# Patient Record
Sex: Male | Born: 1985 | Race: White | Hispanic: No | Marital: Single | Smoking: Current every day smoker
Health system: Southern US, Community
[De-identification: ages and names within clinical notes are randomized; demographics above are authoritative.]

## PROBLEM LIST (undated history)

## (undated) DIAGNOSIS — E079 Disorder of thyroid, unspecified: Secondary | ICD-10-CM

## (undated) HISTORY — PX: TONSILLECTOMY: SUR1361

---
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
## 2008-02-26 ENCOUNTER — Emergency Department: Payer: Self-pay | Admitting: Emergency Medicine

## 2014-01-17 ENCOUNTER — Emergency Department: Payer: Self-pay | Admitting: Emergency Medicine

## 2017-12-16 ENCOUNTER — Other Ambulatory Visit: Payer: Self-pay

## 2017-12-16 ENCOUNTER — Emergency Department
Admission: EM | Admit: 2017-12-16 | Discharge: 2017-12-16 | Disposition: A | Discharge status: Home / Self Care | Payer: Self-pay | Attending: Emergency Medicine | Admitting: Emergency Medicine

## 2017-12-16 ENCOUNTER — Encounter: Payer: Self-pay | Admitting: *Deleted

## 2017-12-16 DIAGNOSIS — F17200 Nicotine dependence, unspecified, uncomplicated: Secondary | ICD-10-CM | POA: Insufficient documentation

## 2017-12-16 DIAGNOSIS — K120 Recurrent oral aphthae: Secondary | ICD-10-CM | POA: Insufficient documentation

## 2017-12-16 MED ORDER — DEXAMETHASONE SODIUM PHOSPHATE 10 MG/ML IJ SOLN
10.0000 mg | Freq: Once | INTRAMUSCULAR | Status: AC
  Administered 2017-12-16: 10 mg via INTRAMUSCULAR
  Filled 2017-12-16: qty 1

## 2017-12-16 MED ORDER — PREDNISONE 10 MG PO TABS: 10.0000 mg | ORAL_TABLET | Freq: Two times a day (BID) | ORAL | 0 refills | Status: DC

## 2017-12-16 MED ORDER — MAGIC MOUTHWASH W/LIDOCAINE: 5.0000 mL | Freq: Four times a day (QID) | ORAL | 0 refills | Status: DC | PRN

## 2017-12-16 MED ORDER — LIDOCAINE VISCOUS HCL 2 % MT SOLN
15.0000 mL | Freq: Once | OROMUCOSAL | Status: AC
  Administered 2017-12-16: 15 mL via OROMUCOSAL
  Filled 2017-12-16: qty 15

## 2017-12-16 NOTE — Discharge Instructions (Addendum)
You are being treated for stomatitis. Take the steroid as directed. Use the prescription mouthwash solution as needed. Follow-up with your provider as needed.

## 2017-12-16 NOTE — ED Triage Notes (Signed)
Pt says that tonight while at work he started having itching and dryness in his mouth. He says he has painful areas that feel like sores on the sides of his tongue and lower lips.

## 2017-12-17 NOTE — ED Provider Notes (Signed)
[facility] Emergency Department Provider Note ____________________________________________  Time seen: 2146  I have reviewed the triage vital signs and the nursing notes.  HISTORY  Chief Complaint  Mouth Pain  History as told to Loney Loh, PA-S Sherrie Sport)  HPI Louis Wade is a 32 y.o. male presents to the ED, from work, and accompanied by his family, for evaluation of sudden onset of mild sores.  Patient describes while at work tonight during his third shift, and to experience itching and dryness to his mouth.  He also describes painful areas that feel like sores, to the size of his tongue and inside his lower lip.  He denies any difficulty breathing, swallowing, or controlling secretions.  Also denies any fevers, chills, sweats.  He has been able to eat and drink without significant difficulty.  He denies any other known exposures or any recent illness.  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  He denies any requirement, however, for PPE doing his typical job activities. Of note is that the patient reports that he and his family were exposed to meningitis, and were apparently treated prophylactically last week.  He, however, admits that he did not take his prescribed prophylaxis, as directed. He is currently about 3 days into a 10-day course of Augmentin, and a topical eardrop, for a presumed right otitis medias/otitis externa.  History reviewed. No pertinent past medical history.  There are no active problems to display for this patient.  Past Surgical History:  Procedure Laterality Date  . TONSILLECTOMY      Prior to Admission medications   Medication Sig Start Date End Date Taking? Authorizing Provider  magic mouthwash w/lidocaine SOLN Take 5 mLs  by mouth 4 (four) times daily as needed for mouth pain. 12/16/17   Bitha Fauteux, Charlesetta Ivory, PA-C  predniSONE (DELTASONE) 10 MG tablet Take 1 tablet (10 mg total) by mouth 2 (two) times daily with a meal. 12/16/17   Yehudit Fulginiti, Charlesetta Ivory, PA-C   Allergies Patient has no known allergies.  No family history on file.  Social History Social History   Tobacco Use  . Smoking status: Current Every Day Smoker  Substance Use Topics  . Alcohol use: Yes  . Drug use: Never   Review of Systems  Constitutional: Negative for fever. Eyes: Negative for visual changes. ENT: Negative for sore throat. Mouth sores as above. Cardiovascular: Negative for chest pain. Respiratory: Negative for shortness of breath. Gastrointestinal: Negative for abdominal pain, vomiting and diarrhea. Genitourinary: Negative for dysuria. Musculoskeletal: Negative for back pain. Skin: Negative for rash. Neurological: Negative for headaches, focal weakness or numbness. ____________________________________________  PHYSICAL EXAM:  VITAL SIGNS: ED Triage Vitals  Enc Vitals Group     BP 12/16/17 2110 (!) 144/89     Pulse Rate 12/16/17 2110 (!) 113     Resp 12/16/17 2110 16     Temp 12/16/17 2110 98.4 F (36.9 C)     Temp src --      SpO2 12/16/17 2110 98 %     Weight 12/16/17 2111 185 lb (83.9 kg)     Height 12/16/17 2111 5\' 9"  (1.753 m)     Head Circumference --      Peak Flow --      Pain Score 12/16/17  2111 4     Pain Loc --      Pain Edu? --      Excl. in GC? --     Constitutional: Alert and oriented. Well appearing and in no distress. Head: Normocephalic and atraumatic. Eyes: Conjunctivae are normal. Normal extraocular movements Ears: Canals clear and without edema. TMs intact bilaterally. Right TM with scant yellow exudate noted.  Nose: No congestion/rhinorrhea/epistaxis. Mouth/Throat: Mucous membranes are moist.  Uvula is midline and tonsils are flat.  No oropharyngeal lesions are appreciated.   Patient with superficial ulceration noted to the lateral aspects of the tongue bilaterally.  He also appears to have a shallow white ulceration to the buccal mucosa of the lower lip.  No brawny sublingual erythema is noted.  No submandibular edema is palpated. Neck: Supple. No thyromegaly. Normal ROM. Hematological/Lymphatic/Immunological: No cervical lymphadenopathy. Cardiovascular: Normal rate, regular rhythm. Normal distal pulses. Respiratory: Normal respiratory effort. No wheezes/rales/rhonchi. Psychiatric: Mood and affect are normal. Patient exhibits appropriate insight and judgment. ____________________________________________  PROCEDURES  Procedures Dexamethasone 10 mg IM 2% viscous lido gargle ____________________________________________  INITIAL IMPRESSION / ASSESSMENT AND PLAN / ED COURSE  Patient with ED evaluation of sudden mouth pain and stomatitis likely due to aphthous ulcers.  He has several distinct ulceration noted to the lateral aspect of his tongue and buccal mucosa.  Patient was treated in the ED with a topical dose of viscous lidocaine.  He is also given an IM injection of Decadron to help with some subjective edema to the tongue.  Patient has control of his oral secretions and no signs of airway compromise.  His symptoms do not appear to be related to a chemical exposure at this time.  Patient will be discharged with a prescription for prednisone to dose as directed.  He is also given a prescription for a mouthwash solution to gargle as needed.  He is encouraged to follow with the local community clinic or return to the ED as needed.  Work is provided for [employer] as requested. ____________________________________________  FINAL CLINICAL IMPRESSION(S) / ED DIAGNOSES  Final diagnoses:  Aphthous ulcer of mouth      Rhianna Raulerson, Charlesetta Ivory, PA-C 12/17/17 1633    Dionne Bucy, MD 12/18/17 1112

## 2018-08-05 ENCOUNTER — Encounter: Payer: Self-pay | Admitting: Psychiatry

## 2018-08-05 ENCOUNTER — Emergency Department (HOSPITAL_COMMUNITY)
Admission: EM | Admit: 2018-08-05 | Discharge: 2018-08-05 | Disposition: A | Discharge status: Other Acute Inpatient Hospital | Payer: 59 | Source: Home / Self Care | Attending: Emergency Medicine | Admitting: Emergency Medicine

## 2018-08-05 ENCOUNTER — Other Ambulatory Visit: Payer: Self-pay

## 2018-08-05 ENCOUNTER — Inpatient Hospital Stay
Admission: AD | Admit: 2018-08-05 | Discharge: 2018-08-09 | DRG: 885 | Disposition: A | Discharge status: Home / Self Care | Payer: 59 | Attending: Psychiatry | Admitting: Psychiatry

## 2018-08-05 DIAGNOSIS — R41843 Psychomotor deficit: Secondary | ICD-10-CM | POA: Diagnosis present

## 2018-08-05 DIAGNOSIS — F1010 Alcohol abuse, uncomplicated: Secondary | ICD-10-CM | POA: Diagnosis present

## 2018-08-05 DIAGNOSIS — Z79890 Hormone replacement therapy (postmenopausal): Secondary | ICD-10-CM | POA: Diagnosis not present

## 2018-08-05 DIAGNOSIS — Z1159 Encounter for screening for other viral diseases: Secondary | ICD-10-CM

## 2018-08-05 DIAGNOSIS — E079 Disorder of thyroid, unspecified: Secondary | ICD-10-CM

## 2018-08-05 DIAGNOSIS — X838XXA Intentional self-harm by other specified means, initial encounter: Secondary | ICD-10-CM | POA: Insufficient documentation

## 2018-08-05 DIAGNOSIS — Y92009 Unspecified place in unspecified non-institutional (private) residence as the place of occurrence of the external cause: Secondary | ICD-10-CM | POA: Insufficient documentation

## 2018-08-05 DIAGNOSIS — F322 Major depressive disorder, single episode, severe without psychotic features: Secondary | ICD-10-CM

## 2018-08-05 DIAGNOSIS — Y903 Blood alcohol level of 60-79 mg/100 ml: Secondary | ICD-10-CM | POA: Insufficient documentation

## 2018-08-05 DIAGNOSIS — T1491XA Suicide attempt, initial encounter: Secondary | ICD-10-CM

## 2018-08-05 DIAGNOSIS — Y939 Activity, unspecified: Secondary | ICD-10-CM | POA: Insufficient documentation

## 2018-08-05 DIAGNOSIS — T50902A Poisoning by unspecified drugs, medicaments and biological substances, intentional self-harm, initial encounter: Secondary | ICD-10-CM

## 2018-08-05 DIAGNOSIS — Y999 Unspecified external cause status: Secondary | ICD-10-CM | POA: Insufficient documentation

## 2018-08-05 DIAGNOSIS — F17200 Nicotine dependence, unspecified, uncomplicated: Secondary | ICD-10-CM | POA: Insufficient documentation

## 2018-08-05 DIAGNOSIS — R51 Headache: Secondary | ICD-10-CM | POA: Diagnosis present

## 2018-08-05 DIAGNOSIS — R519 Headache, unspecified: Secondary | ICD-10-CM

## 2018-08-05 DIAGNOSIS — F323 Major depressive disorder, single episode, severe with psychotic features: Secondary | ICD-10-CM | POA: Diagnosis not present

## 2018-08-05 DIAGNOSIS — R4589 Other symptoms and signs involving emotional state: Secondary | ICD-10-CM | POA: Diagnosis present

## 2018-08-05 DIAGNOSIS — T423X2A Poisoning by barbiturates, intentional self-harm, initial encounter: Secondary | ICD-10-CM | POA: Insufficient documentation

## 2018-08-05 DIAGNOSIS — R45851 Suicidal ideations: Secondary | ICD-10-CM | POA: Insufficient documentation

## 2018-08-05 DIAGNOSIS — Z20828 Contact with and (suspected) exposure to other viral communicable diseases: Secondary | ICD-10-CM | POA: Insufficient documentation

## 2018-08-05 DIAGNOSIS — T50901A Poisoning by unspecified drugs, medicaments and biological substances, accidental (unintentional), initial encounter: Secondary | ICD-10-CM

## 2018-08-05 DIAGNOSIS — F329 Major depressive disorder, single episode, unspecified: Secondary | ICD-10-CM | POA: Diagnosis not present

## 2018-08-05 HISTORY — DX: Disorder of thyroid, unspecified: E07.9

## 2018-08-05 LAB — CBC WITH DIFFERENTIAL/PLATELET
Abs Immature Granulocytes: 0.04 10*3/uL (ref 0.00–0.07)
Basophils Absolute: 0 10*3/uL (ref 0.0–0.1)
Basophils Relative: 1 %
Eosinophils Absolute: 0.1 10*3/uL (ref 0.0–0.5)
Eosinophils Relative: 2 %
HCT: 43.3 % (ref 39.0–52.0)
Hemoglobin: 15.3 g/dL (ref 13.0–17.0)
Immature Granulocytes: 1 %
Lymphocytes Relative: 34 %
Lymphs Abs: 2 10*3/uL (ref 0.7–4.0)
MCH: 33.8 pg (ref 26.0–34.0)
MCHC: 35.3 g/dL (ref 30.0–36.0)
MCV: 95.8 fL (ref 80.0–100.0)
Monocytes Absolute: 0.5 10*3/uL (ref 0.1–1.0)
Monocytes Relative: 9 %
Neutro Abs: 3.1 10*3/uL (ref 1.7–7.7)
Neutrophils Relative %: 53 %
Platelets: 317 10*3/uL (ref 150–400)
RBC: 4.52 MIL/uL (ref 4.22–5.81)
RDW: 12.4 % (ref 11.5–15.5)
WBC: 5.8 10*3/uL (ref 4.0–10.5)
nRBC: 0 % (ref 0.0–0.2)

## 2018-08-05 LAB — COMPREHENSIVE METABOLIC PANEL
ALT: 96 U/L — ABNORMAL HIGH (ref 0–44)
AST: 65 U/L — ABNORMAL HIGH (ref 15–41)
Albumin: 5 g/dL (ref 3.5–5.0)
Alkaline Phosphatase: 76 U/L (ref 38–126)
Anion gap: 13 (ref 5–15)
BUN: 10 mg/dL (ref 6–20)
CO2: 22 mmol/L (ref 22–32)
Calcium: 9.2 mg/dL (ref 8.9–10.3)
Chloride: 103 mmol/L (ref 98–111)
Creatinine, Ser: 0.86 mg/dL (ref 0.61–1.24)
GFR calc Af Amer: >60 mL/min (ref >60)
GFR calc non Af Amer: >60 mL/min (ref >60)
Glucose, Bld: 111 mg/dL — ABNORMAL HIGH (ref 70–99)
Potassium: 3.4 mmol/L — ABNORMAL LOW (ref 3.5–5.1)
Sodium: 138 mmol/L (ref 135–145)
Total Bilirubin: 0.3 mg/dL (ref 0.3–1.2)
Total Protein: 8.6 g/dL — ABNORMAL HIGH (ref 6.5–8.1)

## 2018-08-05 LAB — URINALYSIS, COMPLETE (UACMP) WITH MICROSCOPIC
Bacteria, UA: NONE SEEN
Bilirubin Urine: NEGATIVE
Glucose, UA: NEGATIVE mg/dL
Hgb urine dipstick: NEGATIVE
Ketones, ur: NEGATIVE mg/dL
Leukocytes,Ua: NEGATIVE
Nitrite: NEGATIVE
Protein, ur: NEGATIVE mg/dL
Specific Gravity, Urine: 1.02 (ref 1.005–1.030)
Squamous Epithelial / HPF: NONE SEEN (ref 0–5)
pH: 6 (ref 5.0–8.0)

## 2018-08-05 LAB — URINE DRUG SCREEN, QUALITATIVE (ARMC ONLY)
Amphetamines, Ur Screen: NOT DETECTED
Barbiturates, Ur Screen: POSITIVE — AB
Benzodiazepine, Ur Scrn: NOT DETECTED
Cannabinoid 50 Ng, Ur ~~LOC~~: NOT DETECTED
Cocaine Metabolite,Ur ~~LOC~~: NOT DETECTED
MDMA (Ecstasy)Ur Screen: NOT DETECTED
Methadone Scn, Ur: NOT DETECTED
Opiate, Ur Screen: NOT DETECTED
Phencyclidine (PCP) Ur S: NOT DETECTED
Tricyclic, Ur Screen: NOT DETECTED

## 2018-08-05 LAB — ACETAMINOPHEN LEVEL: Acetaminophen (Tylenol), Serum: 17 ug/mL (ref 10–30)

## 2018-08-05 LAB — SARS CORONAVIRUS 2 BY RT PCR (HOSPITAL ORDER, PERFORMED IN ~~LOC~~ HOSPITAL LAB): SARS Coronavirus 2: NEGATIVE

## 2018-08-05 LAB — ETHANOL: Alcohol, Ethyl (B): 75 mg/dL — ABNORMAL HIGH (ref <10)

## 2018-08-05 LAB — LIPASE, BLOOD: Lipase: 26 U/L (ref 11–51)

## 2018-08-05 LAB — SALICYLATE LEVEL: Salicylate Lvl: <7 mg/dL (ref 2.8–30.0)

## 2018-08-05 MED ORDER — LORAZEPAM 1 MG PO TABS: 1.0000 mg | ORAL_TABLET | ORAL | Status: DC | PRN

## 2018-08-05 MED ORDER — ACETAMINOPHEN 325 MG PO TABS: 650.0000 mg | ORAL_TABLET | Freq: Four times a day (QID) | ORAL | Status: DC | PRN

## 2018-08-05 MED ORDER — ALUM & MAG HYDROXIDE-SIMETH 200-200-20 MG/5ML PO SUSP: 30.0000 mL | ORAL | Status: DC | PRN

## 2018-08-05 MED ORDER — NICOTINE 21 MG/24HR TD PT24: 21.0000 mg | MEDICATED_PATCH | Freq: Every day | TRANSDERMAL | Status: DC

## 2018-08-05 MED ORDER — ZIPRASIDONE MESYLATE 20 MG IM SOLR: 20.0000 mg | INTRAMUSCULAR | Status: DC | PRN

## 2018-08-05 MED ORDER — DIPHENHYDRAMINE HCL 25 MG PO CAPS: 50.0000 mg | ORAL_CAPSULE | Freq: Four times a day (QID) | ORAL | Status: DC | PRN

## 2018-08-05 MED ORDER — RISPERIDONE 1 MG PO TBDP
2.0000 mg | ORAL_TABLET | Freq: Three times a day (TID) | ORAL | Status: DC | PRN
  Filled 2018-08-05: qty 2

## 2018-08-05 MED ORDER — NICOTINE POLACRILEX 2 MG MT GUM
2.0000 mg | CHEWING_GUM | OROMUCOSAL | Status: DC | PRN
  Filled 2018-08-05: qty 1

## 2018-08-05 MED ORDER — MAGNESIUM HYDROXIDE 400 MG/5ML PO SUSP: 30.0000 mL | Freq: Every day | ORAL | Status: DC | PRN

## 2018-08-05 MED ORDER — NICOTINE 14 MG/24HR TD PT24
14.0000 mg | MEDICATED_PATCH | Freq: Every day | TRANSDERMAL | Status: DC
  Administered 2018-08-06 – 2018-08-09 (×4): 14 mg via TRANSDERMAL
  Filled 2018-08-05 (×4): qty 1

## 2018-08-05 NOTE — Plan of Care (Signed)
Patient just recently admitted to the unit. Patient has not had sufficient time to show progressions at this time. Will continue to monitor for progressions.    Problem: Education: Goal: Emotional status will improve Outcome: Not Progressing Goal: Mental status will improve Outcome: Not Progressing   Problem: Health Behavior/Discharge Planning: Goal: Compliance with treatment plan for underlying cause of condition will improve Outcome: Not Progressing   Problem: Safety: Goal: Periods of time without injury will increase Outcome: Not Progressing   Problem: Coping: Goal: Coping ability will improve Outcome: Not Progressing   Problem: Safety: Goal: Ability to disclose and discuss suicidal ideas will improve Outcome: Not Progressing

## 2018-08-05 NOTE — ED Triage Notes (Signed)
Pt in via police d/t OD at proximately 5am per pt; pt unsure of name of substance but states it was newly prescribed for his migraines; pt unsure how many he took; pt alert; pt denies pain; pt A&Ox4; pt denies thoughts/ideas/plans to harm self or others.

## 2018-08-05 NOTE — ED Notes (Signed)
Pt notified that Maralyn Sago NT will be checking on pt frequently so as not to be alarmed by this. Sarah NT agrees to complete checks on pt; documented on printed sheet to go in paper chart; pt understands room door must stay open; pt understands he'll need to be attached to vitals machine until medically cleared by EDP; pt denies thoughts/ideas/intentions to harm self or others; pt alert/calm; given tv remote; rail up; bed locked in lowest position.

## 2018-08-05 NOTE — ED Notes (Signed)

## 2018-08-05 NOTE — ED Notes (Addendum)
Pt states blood drawn in triage.

## 2018-08-05 NOTE — ED Notes (Signed)
Pt admitted to beh med, report called to [employer]

## 2018-08-05 NOTE — ED Notes (Signed)
Pt calmly resting in bed; door remains open; pt denies any needs.

## 2018-08-05 NOTE — ED Notes (Signed)
Urine sample sent to lab

## 2018-08-05 NOTE — BH Assessment (Addendum)
Assessment Note  Louis Wade is an 33 y.o. male who presents to the ER via law enforcement. Patient reports of having no memory of what took place, that caused him to come to the ER. "I must have drank too much and text somebody. My sister woke me up and the police was there." Patient further reports, he's been sober for approximately a month and that's the longest he's went without any alcohol. Per IVC, patient sent his ex-wife and ex-wife girlfriend a text message that cause both of them to get concerned. It's unclear who contacted the patient's sister but he believes it must have been someone he texted. "How else she would have known to come to my house?"    Patient reports he was having thoughts of ending his life and he took extra of his migraine medications. IVC states, the patient had bottles open, near where he was passed out at. IVC also states the patient told his sister he took the pills to end his life.  Patient reports of having a good relationship with his 20 year old son and his ex-wife. Several days throughout the week, the son lives with him. The other days he is with the mother, patient's ex-wife. He states, since the birth of his son, he reduced the amount of mind-altering substances. Prior to that, he was using "the heavy stuff." His relationship with his parents is strained and distant. It's unclear if it is due to his drug use or other factors. "I just started back talking with them (parents). I went some years without talking..."  During the interview, the patient was calm, cooperative and pleasant. He denies HI and AV/H. He denies any involvement with the legal system. He also denies the use of violence or aggression.   Diagnosis: Depression   Past Medical History:  Past Medical History:  Diagnosis Date  . Thyroid disease     Past Surgical History:  Procedure Laterality Date  . TONSILLECTOMY      Family History: History reviewed. No pertinent family  history.  Social History:  reports that he has been smoking. He has never used smokeless tobacco. He reports current alcohol use. He reports previous drug use. Drug: Marijuana.  Additional Social History:  Alcohol / Drug Use Pain Medications: See PTA Prescriptions: See PTA Over the Counter: See PTA History of alcohol / drug use?: Yes Longest period of sobriety (when/how long): "A month" Negative Consequences of Use: Personal relationships, Work / School Withdrawal Symptoms: (n/a) Substance #1 Name of Substance 1: Alcohol 1 - Last Use / Amount: 08/04/2018 Substance #2 Name of Substance 2: Cannabis 2 - Last Use / Amount: 08/04/2018  CIWA: CIWA-Ar BP: 132/80 Pulse Rate: 98 COWS:    Allergies: No Known Allergies  Home Medications: (Not in a hospital admission)   OB/GYN Status:  No LMP for male patient.  General Assessment Data Location of Assessment: [facility] ED TTS Assessment: In system Is this a Tele or Face-to-Face Assessment?: Face-to-Face Is this an Initial Assessment or a Re-assessment for this encounter?: Initial Assessment Language Other than English: No Living Arrangements: Other (Comment)(Private Home) What gender do you identify as?: Male Marital status: Divorced Pregnancy Status: No Living Arrangements: Alone Can pt return to current living arrangement?: Yes Admission Status: Involuntary Petitioner: Police Is patient capable of signing voluntary admission?: No(Under IVC) Referral Source: Self/Family/Friend Insurance type: UHC  Medical Screening Exam [facility] Walk-in ONLY) Medical Exam completed: Yes  Crisis Care Plan Living Arrangements: Alone Name of Psychiatrist: Reports  of none Name of Therapist: Reports of none  Education Status Is patient currently in school?: No Is the patient employed, unemployed or receiving disability?: Employed  Risk to self with the past 6 months Suicidal Ideation: Yes-Currently Present Has patient been a risk to self within  the past 6 months prior to admission? : Yes Suicidal Intent: Yes-Currently Present Has patient had any suicidal intent within the past 6 months prior to admission? : Yes Is patient at risk for suicide?: Yes Suicidal Plan?: Yes-Currently Present Has patient had any suicidal plan within the past 6 months prior to admission? : Yes Specify Current Suicidal Plan: Overdose on medications Access to Means: Yes Specify Access to Suicidal Means: overdose on medications What has been your use of drugs/alcohol within the last 12 months?: Alcohol & Cannabis Previous Attempts/Gestures: Yes How many times?: 1 Other Self Harm Risks: Reports of none Triggers for Past Attempts: Unknown Intentional Self Injurious Behavior: None Family Suicide History: Unknown Recent stressful life event(s): Other (Comment), Loss (Comment), Turmoil (Comment) Persecutory voices/beliefs?: No Depression: Yes Depression Symptoms: Guilt, Feeling worthless/self pity Substance abuse history and/or treatment for substance abuse?: Yes Suicide prevention information given to non-admitted patients: Not applicable  Risk to Others within the past 6 months Homicidal Ideation: No Does patient have any lifetime risk of violence toward others beyond the six months prior to admission? : No Thoughts of Harm to Others: No Current Homicidal Intent: No Current Homicidal Plan: No Access to Homicidal Means: No Identified Victim: Reports of none History of harm to others?: No Assessment of Violence: None Noted Violent Behavior Description: Reports of none Does patient have access to weapons?: No Does patient have a court date: No Is patient on probation?: No  Psychosis Hallucinations: None noted Delusions: None noted  Mental Status Report Appearance/Hygiene: Unremarkable, In scrubs Eye Contact: Poor Motor Activity: Freedom of movement, Unremarkable Speech: Logical/coherent, Unremarkable Level of Consciousness: Alert Mood:  Depressed, Anxious, Sad, Pleasant Affect: Appropriate to circumstance, Depressed, Sad Anxiety Level: None Thought Processes: Coherent, Relevant Judgement: Unimpaired Orientation: Person, Place, Time, Situation, Appropriate for developmental age Obsessive Compulsive Thoughts/Behaviors: Minimal  Cognitive Functioning Concentration: Normal Memory: Recent Intact, Remote Intact Is patient IDD: No Insight: Fair Impulse Control: Fair Appetite: Good Have you had any weight changes? : No Change Sleep: No Change Total Hours of Sleep: 5 Vegetative Symptoms: None  ADLScreening [facility] Assessment Services) Patient's cognitive ability adequate to safely complete daily activities?: Yes Patient able to express need for assistance with ADLs?: Yes Independently performs ADLs?: Yes (appropriate for developmental age)  Prior Inpatient Therapy Prior Inpatient Therapy: No  Prior Outpatient Therapy Prior Outpatient Therapy: No Does patient have an ACCT team?: No Does patient have Intensive In-House Services?  : No Does patient have [facility] services? : No Does patient have P4CC services?: No  ADL Screening (condition at time of admission) Patient's cognitive ability adequate to safely complete daily activities?: Yes Is the patient deaf or have difficulty hearing?: No Does the patient have difficulty seeing, even when wearing glasses/contacts?: No Does the patient have difficulty concentrating, remembering, or making decisions?: No Patient able to express need for assistance with ADLs?: Yes Does the patient have difficulty dressing or bathing?: No Independently performs ADLs?: Yes (appropriate for developmental age) Does the patient have difficulty walking or climbing stairs?: No Weakness of Legs: None Weakness of Arms/Hands: None  Home Assistive Devices/Equipment Home Assistive Devices/Equipment: None  Therapy Consults (therapy consults require a physician order) PT Evaluation Needed: No OT  Evalulation Needed:  No SLP Evaluation Needed: No Abuse/Neglect Assessment (Assessment to be complete while patient is alone) Abuse/Neglect Assessment Can Be Completed: Yes Physical Abuse: Denies Verbal Abuse: Denies Sexual Abuse: Denies Exploitation of patient/patient's resources: Denies Self-Neglect: Denies Values / Beliefs Cultural Requests During Hospitalization: None Spiritual Requests During Hospitalization: None Consults Spiritual Care Consult Needed: No Social Work Consult Needed: No Merchant navy officerAdvance Directives (For Healthcare) Does Patient Have a Medical Advance Directive?: No       Child/Adolescent Assessment Running Away Risk: Denies(Patient is an adult)  Disposition:  Disposition Initial Assessment Completed for this Encounter: Yes  On Site Evaluation by:   Reviewed with Physician:    Lilyan Gilfordalvin J. Jann Ra MS, LCAS, [facility], NCC, CCSI Therapeutic Triage Specialist 08/05/2018 4:09 PM

## 2018-08-05 NOTE — ED Notes (Signed)
IVC  PENDING  GOING  TO  [facility]

## 2018-08-05 NOTE — ED Notes (Signed)
BEHAVIORAL HEALTH ROUNDING Patient sleeping: Yes.   Patient alert and oriented: eyes closed  Appears asleep Behavior appropriate: Yes.  ; If no, describe:  Nutrition and fluids offered: Yes  Toileting and hygiene offered: sleeping Sitter present: q 15 minute observations and security monitoring Law enforcement present: yes  ODS 

## 2018-08-05 NOTE — ED Notes (Signed)
Pt's door remains open; pt resting in bed; pt calm/alert; rail up; bed locked in lowest position.

## 2018-08-05 NOTE — ED Provider Notes (Signed)
[facility] Emergency Department Provider Note  ____________________________________________  Time seen: Approximately 2:39 PM  I have reviewed the triage vital signs and the nursing notes.   HISTORY  Chief Complaint Suicidal    HPI Louis Wade is a 33 y.o. male with a past history of thyroid disease and alcohol abuse who comes to the ED today under involuntary commitment due to a suicide attempt at home.  Patient reports that last night he felt very depressed.  He drank about 1/2 gallon of alcohol and took an overdose of an unknown amount of his "migraine medicine" that was prescribed to him by Dr. Jim LikeNiemeier, and then passed out.  He woke up with his sister shaking his leg.  According to IVC paperwork, he had texted his ex-girlfriend and ex-wife stating that he intended to kill himself.  He currently denies suicidal ideation HI or hallucinations.  He reports that he had been sober from alcohol abuse for the past month until last night.  Denies any drug use.  Smokes daily.      Past Medical History:  Diagnosis Date  . Thyroid disease      There are no active problems to display for this patient.    Past Surgical History:  Procedure Laterality Date  . TONSILLECTOMY       Prior to Admission medications   Medication Sig Start Date End Date Taking? Authorizing Provider  magic mouthwash w/lidocaine SOLN Take 5 mLs by mouth 4 (four) times daily as needed for mouth pain. 12/16/17   Menshew, Charlesetta IvoryJenise V Bacon, PA-C  predniSONE (DELTASONE) 10 MG tablet Take 1 tablet (10 mg total) by mouth 2 (two) times daily with a meal. 12/16/17   Menshew, Charlesetta IvoryJenise V Bacon, PA-C     Allergies Patient has no known allergies.   History reviewed. No pertinent family history.  Social History Social History   Tobacco Use  . Smoking status: Current Every Day Smoker  . Smokeless tobacco: Never Used  Substance Use Topics  . Alcohol use: Yes  . Drug use: Not  Currently    Types: Marijuana    Review of Systems  Constitutional:   No fever or chills.  ENT:   No sore throat. No rhinorrhea. Cardiovascular:   No chest pain or syncope. Respiratory:   No dyspnea or cough. Gastrointestinal:   Negative for abdominal pain, vomiting and diarrhea.  Musculoskeletal:   Negative for focal pain or swelling All other systems reviewed and are negative except as documented above in ROS and HPI.  ____________________________________________   PHYSICAL EXAM:  VITAL SIGNS: ED Triage Vitals  Enc Vitals Group     BP 08/05/18 1310 (!) 151/88     Pulse Rate 08/05/18 1310 (!) 103     Resp 08/05/18 1345 (!) 25     Temp --      Temp src --      SpO2 08/05/18 1310 96 %     Weight 08/05/18 1312 185 lb (83.9 kg)     Height 08/05/18 1312 5\' 9"  (1.753 m)     Head Circumference --      Peak Flow --      Pain Score 08/05/18 1312 0     Pain Loc --      Pain Edu? --      Excl. in GC? --     Vital signs reviewed, nursing assessments reviewed.   Constitutional:   Alert and oriented. Non-toxic appearance. Eyes:   Conjunctivae are normal. EOMI.  PERRL, midsize.  No nystagmus ENT      Head:   Normocephalic and atraumatic.      Nose:   No congestion/rhinnorhea.       Mouth/Throat:   MMM, no pharyngeal erythema. No peritonsillar mass.       Neck:   No meningismus. Full ROM. Hematological/Lymphatic/Immunilogical:   No cervical lymphadenopathy. Cardiovascular:   Tachycardia heart rate 100. Symmetric bilateral radial and DP pulses.  No murmurs. Cap refill less than 2 seconds. Respiratory:   Normal respiratory effort without tachypnea/retractions. Breath sounds are clear and equal bilaterally. No wheezes/rales/rhonchi. Gastrointestinal:   Soft and nontender. Non distended. There is no CVA tenderness.  No rebound, rigidity, or guarding.  Musculoskeletal:   Normal range of motion in all extremities. No joint effusions.  No lower extremity tenderness.  No  edema. Neurologic:   Normal speech and language.  Motor grossly intact. No acute focal neurologic deficits are appreciated.  Skin:    Skin is warm, dry and intact. No rash noted.  No petechiae, purpura, or bullae.  ____________________________________________    LABS (pertinent positives/negatives) (all labs ordered are listed, but only abnormal results are displayed) Labs Reviewed  COMPREHENSIVE METABOLIC PANEL - Abnormal; Notable for the following components:      Result Value   Potassium 3.4 (*)    Glucose, Bld 111 (*)    Total Protein 8.6 (*)    AST 65 (*)    ALT 96 (*)    All other components within normal limits  ETHANOL - Abnormal; Notable for the following components:   Alcohol, Ethyl (B) 75 (*)    All other components within normal limits  URINALYSIS, COMPLETE (UACMP) WITH MICROSCOPIC - Abnormal; Notable for the following components:   Color, Urine YELLOW (*)    APPearance CLEAR (*)    All other components within normal limits  URINE DRUG SCREEN, QUALITATIVE ([facility] ONLY) - Abnormal; Notable for the following components:   Barbiturates, Ur Screen POSITIVE (*)    All other components within normal limits  SARS CORONAVIRUS 2 (HOSPITAL ORDER, PERFORMED IN [facility])  ACETAMINOPHEN LEVEL  LIPASE, BLOOD  SALICYLATE LEVEL  CBC WITH DIFFERENTIAL/PLATELET   ____________________________________________   EKG  Interpreted by me Sinus tachycardia rate 104, normal axis intervals QRS ST segments and T waves.  Narrow QRS.  No prolonged terminal R wave in aVR.  QTc normal  ____________________________________________    RADIOLOGY  No results found.  ____________________________________________   PROCEDURES Procedures  ____________________________________________    CLINICAL IMPRESSION / ASSESSMENT AND PLAN / ED COURSE  Medications ordered in the ED: Medications - No data to display  Pertinent labs & imaging results that were available during  my care of the patient were reviewed by me and considered in my medical decision making (see chart for details).  Louis Wade was evaluated in Emergency Department on 08/05/2018 for the symptoms described in the history of present illness. He was evaluated in the context of the global COVID-19 pandemic, which necessitated consideration that the patient might be at risk for infection with the SARS-CoV-2 virus that causes COVID-19. Institutional protocols and algorithms that pertain to the evaluation of patients at risk for COVID-19 are in a state of rapid change based on information released by regulatory bodies including the CDC and federal and state organizations. These policies and algorithms were followed during the patient's care in the ED.   Patient presents with symptoms of depression, polysubstance abuse and intentional overdose in a  suicide attempt.  Arrives under IVC which I will continue pending psychiatry evaluation.  Mental status is improving from last night, is spending significant amount of time since his overdose, low risk for recurrent depression of mental status or respirations.  Appears to be medically stable.  No evidence of withdrawal or toxidrome.  Clinical Course as of Aug 04 1437  Thu Aug 05, 2018  1421 Barbiturates in UDS, Tylenol level therapeutic at 17.  Migraine medicine may have been Fioricet (acetaminophen, caffeine, butalbital).  No prescriptions listed in drug database.  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 Dictation errors may occur despite best attempts at proofreading.   Sharman Cheek, MD 08/05/18 [phone]

## 2018-08-05 NOTE — BH Assessment (Addendum)
Patient is to be admitted to [facility] by Dr. Viviano Simas.  Attending Physician will be Dr. Toni Amend.   Patient has been assigned to room 312, by [facility] Charge Nurse Shatara.   ER staff is aware of the admission:  Misty Stanley, ER Secretary    Dr. Roxan Hockey, ER MD   Amy T., Patient's Nurse   Sharmon Leyden, Patient Access.

## 2018-08-05 NOTE — Progress Notes (Signed)
D: Received patient from [facility] Emergency Department. Patient skin assessment completed with Baxter Hire, RN, skin is intact, but did have bilateral ankle scarring from, "my work boots", no contraband found with all unit prohibited items locked and stored away for discharge. Pt. Was admitted under the services of, Dr. Toni Amend.  Pt. During the admissions process is pleasant and cooperative, able to complete all required paperwork. Pt. Endorses currently a normal mood and denies si/hi. Pt. Reports he has been having, "suicidal thoughts on and off" and had recently been drinking. Pt. Contracts verbally for safety on the unit. Pt. Denies anxiety and or depression at this time. Pt. Expresses he has been having family and work conflict in his life that is difficult to deal with.   A: Patient oriented to unit/room/call light. Pt. Given extensive admissions education. Patient was encourage to participate in unit activities and continue with plan of care being put into place. Q x 15 minute observation checks were initiated for safety.   R: Patient is receptive to treatment  being put into place and safety to be maintained on unit per MD orders.

## 2018-08-05 NOTE — ED Notes (Signed)
Pt continues to rest in bed calmly watching tv.

## 2018-08-05 NOTE — Tx Team (Signed)
Initial Treatment Plan 08/05/2018 11:24 PM [name] WNI:[id number]    PATIENT STRESSORS: Marital or family conflict Substance abuse   PATIENT STRENGTHS: Ability for insight Average or above average intelligence Capable of independent living Metallurgist fund of knowledge Motivation for treatment/growth Physical Health Special hobby/interest Supportive family/friends Work skills   PATIENT IDENTIFIED PROBLEMS: Suicidal thoughts 08/05/2018  Depression 08/05/2018  Family and work conflict 08/05/2018                 DISCHARGE CRITERIA:  Improved stabilization in mood, thinking, and/or behavior Motivation to continue treatment in a less acute level of care Need for constant or close observation no longer present Verbal commitment to aftercare and medication compliance  PRELIMINARY DISCHARGE PLAN: Outpatient therapy Participate in family therapy Return to previous living arrangement Return to previous work or school arrangements  PATIENT/FAMILY INVOLVEMENT: This treatment plan has been presented to and reviewed with the patient, Louis Wade.The patient has been given the opportunity to ask questions and make suggestions.  Lenox Ponds, RN 08/05/2018, 11:24 PM

## 2018-08-05 NOTE — ED Notes (Signed)
Pt dressed out while in triage; blood drawn in triage; pt OD on purpose on unknown substance.

## 2018-08-05 NOTE — ED Notes (Signed)
EKG completed

## 2018-08-05 NOTE — Consult Note (Signed)
[facility]   Reason for Consult:  Suicide attempt by overdose Referring Physician:  Dr. Scotty CourtStafford Patient Identification: Louis HolmDustin L Brandstetter MRN:  [id number] Principal Diagnosis: Overdose Diagnosis:  Principal Problem:   Overdose Active Problems:   Psychosis (HCC)   Suicidal behavior   MDD (major depressive disorder), severe (HCC)   Alcohol abuse   Thyroid disease  Seen in office patient is seen, chart is reviewed.  Total Time spent with patient: 1 hour  Subjective:  "I had a bad day and I do not handle stress well."   Mood fluctuations with sleep and hyperactivity.  HPI: Louis Wade is a 33 y.o. male patient with a past history of thyroid disease and alcohol abuse who comes to the ED today under involuntary commitment due to a suicide attempt at home.  Patient reports that last night he felt very depressed.  He drank about 1/2 gallon of alcohol and took an overdose of an unknown amount of his "migraine medicine" that was prescribed to him by Dr. Jim LikeNiemeier, and then passed out.  He woke up with his sister shaking his leg.  According to IVC paperwork, he had texted his ex-girlfriend and ex-wife stating that he intended to kill himself. He currently denies suicidal ideation HI or hallucinations. He reports that he had been sober from alcohol abuse for the past month until last night.  Denies any drug use.  Smokes daily.  On evaluation, patient is calm and cooperative.  He describes a history of mood fluctuations with mostly depressive symptoms where he has increased sleep, increased isolation, difficulty going to work, poor concentration, low energy and poor appetite.  He has had suicide thoughts that come and go, "that I deal with".  He describes other days in which he does not require much sleep and is more hyperactive.  He does not describe discrete mania. He denies past suicide attempts, history of self-harm, or mental health treatment.  Patient describes  that he had quit drinking alcohol approximately a month ago, but notes that he was not an excessive drinker before this.  On the morning prior to his overdose, he states, "I had a tough day, got bad news. I was told I had a thyroid problem, and my dad called and said my mom was put in an institution.  I just do not handle stress well.  I remember going to the liquor store and buying 1/2 gallon of whiskey.  I remember taking 1-2 migraine pills, and then having a bunch of pills in my hand.  I do not remember texting goodbye messages.  The next thing I remember is my sister waking me up by hitting my leg."  Patient describes that he now feels embarrassed and is no longer endorsing suicidal intent.  He states he has to live for his 31-year-old son.  Past Psychiatric History: No past history, "always feels down".  Risk to Self:  denies Risk to Others:  denies Prior Inpatient Therapy:  none Prior Outpatient Therapy:  none  Past Medical History:  Past Medical History:  Diagnosis Date  . Thyroid disease     Past Surgical History:  Procedure Laterality Date  . TONSILLECTOMY     Family History: History reviewed. No pertinent family history.  Family Psychiatric  History: mother, sister dad with depression and anxiety Father alcohol and drug abuse  Social History:  Social History   Substance and Sexual Activity  Alcohol Use Yes     Social History   Substance  and Sexual Activity  Drug Use Not Currently  . Types: Marijuana    Social History   Socioeconomic History  . Marital status: Single    Spouse name: Not on file  . Number of children: Not on file  . Years of education: Not on file  . Highest education level: Not on file  Occupational History  . Not on file  Social Needs  . Financial resource strain: Not on file  . Food insecurity:    Worry: Not on file    Inability: Not on file  . Transportation needs:    Medical: Not on file    Non-medical: Not on file  Tobacco Use  .  Smoking status: Current Every Day Smoker  . Smokeless tobacco: Never Used  Substance and Sexual Activity  . Alcohol use: Yes  . Drug use: Not Currently    Types: Marijuana  . Sexual activity: Not on file  Lifestyle  . Physical activity:    Days per week: Not on file    Minutes per session: Not on file  . Stress: Not on file  Relationships  . Social connections:    Talks on phone: Not on file    Gets together: Not on file    Attends religious service: Not on file    Active member of club or organization: Not on file    Attends meetings of clubs or organizations: Not on file    Relationship status: Not on file  Other Topics Concern  . Not on file  Social History Narrative  . Not on file   Additional Social History:  Separated/divorced x 4/2 years ago, has joint custody of 74 year old son Single Works as Building surveyor, as a Visual merchandiser, works 3rd shift. Has missed work because of migraines and "laying out He describes a close relationship with his parents and sister.  Reports previous alcohol use 2-4 drinks a couple times a week, had not had alcohol in 1 month prior to last night Marijuana-endorses using last night (UDS negative for cannabinoids), usually every 3 months Tobacco 1/2 ppd, requests nicotine patch    Allergies:  No Known Allergies  Labs:  Results for orders placed or performed during the hospital encounter of 08/05/18 (from the past 48 hour(s))  Acetaminophen level     Status: None   Collection Time: 08/05/18 11:41 AM  Result Value Ref Range   Acetaminophen (Tylenol), Serum 17 10 - 30 ug/mL    Comment: (NOTE) Therapeutic concentrations vary significantly. A range of 10-30 ug/mL  may be an effective concentration for many patients. However, some  are best treated at concentrations outside of this range. Acetaminophen concentrations >150 ug/mL at 4 hours after ingestion  and >50 ug/mL at 12 hours after ingestion are often associated with  toxic  reactions. Performed at [facility], [address] [place], [place] [address]   Comprehensive metabolic panel     Status: Abnormal   Collection Time: 08/05/18 11:41 AM  Result Value Ref Range   Sodium 138 135 - 145 mmol/L   Potassium 3.4 (L) 3.5 - 5.1 mmol/L   Chloride 103 98 - 111 mmol/L   CO2 22 22 - 32 mmol/L   Glucose, Bld 111 (H) 70 - 99 mg/dL   BUN 10 6 - 20 mg/dL   Creatinine, Ser 0.86 0.61 - 1.24 mg/dL   Calcium 9.2 8.9 - 57.8 mg/dL   Total Protein 8.6 (H) 6.5 - 8.1 g/dL   Albumin 5.0 3.5 -  5.0 g/dL   AST 65 (H) 15 - 41 U/L   ALT 96 (H) 0 - 44 U/L   Alkaline Phosphatase 76 38 - 126 U/L   Total Bilirubin 0.3 0.3 - 1.2 mg/dL   GFR calc non Af Amer >60 >60 mL/min   GFR calc Af Amer >60 >60 mL/min   Anion gap 13 5 - 15    Comment: Performed at [facility], [address], [place], [place] [address]  Ethanol     Status: Abnormal   Collection Time: 08/05/18 11:41 AM  Result Value Ref Range   Alcohol, Ethyl (B) 75 (H) <10 mg/dL    Comment: (NOTE) Lowest detectable limit for serum alcohol is 10 mg/dL. For medical purposes only. Performed at [facility], [address] [place], [place] [address]   Lipase, blood     Status: None   Collection Time: 08/05/18 11:41 AM  Result Value Ref Range   Lipase 26 11 - 51 U/L    Comment: Performed at [facility], [address] [place], [place] [address]  Salicylate level     Status: None   Collection Time: 08/05/18 11:41 AM  Result Value Ref Range   Salicylate Lvl <7.0 2.8 - 30.0 mg/dL    Comment: Performed at [facility], [address] [place], [place] [address]  CBC with Differential     Status: None   Collection Time: 08/05/18 11:41 AM  Result Value Ref Range   WBC 5.8 4.0 - 10.5 K/uL   RBC 4.52 4.22 - 5.81 MIL/uL   Hemoglobin 15.3 13.0 - 17.0 g/dL   HCT 53.6 64.4 - 03.4 %   MCV 95.8 80.0 - 100.0 fL   MCH 33.8 26.0 - 34.0 pg   MCHC 35.3 30.0 - 36.0 g/dL   RDW 74.2  59.5 - 63.8 %   Platelets 317 150 - 400 K/uL   nRBC 0.0 0.0 - 0.2 %   Neutrophils Relative % 53 %   Neutro Abs 3.1 1.7 - 7.7 K/uL   Lymphocytes Relative 34 %   Lymphs Abs 2.0 0.7 - 4.0 K/uL   Monocytes Relative 9 %   Monocytes Absolute 0.5 0.1 - 1.0 K/uL   Eosinophils Relative 2 %   Eosinophils Absolute 0.1 0.0 - 0.5 K/uL   Basophils Relative 1 %   Basophils Absolute 0.0 0.0 - 0.1 K/uL   Immature Granulocytes 1 %   Abs Immature Granulocytes 0.04 0.00 - 0.07 K/uL    Comment: Performed at [facility], [address] [place], [place] [address]  Urinalysis, Complete w Microscopic     Status: Abnormal   Collection Time: 08/05/18  1:23 PM  Result Value Ref Range   Color, Urine YELLOW (A) YELLOW   APPearance CLEAR (A) CLEAR   Specific Gravity, Urine 1.020 1.005 - 1.030   pH 6.0 5.0 - 8.0   Glucose, UA NEGATIVE NEGATIVE mg/dL   Hgb urine dipstick NEGATIVE NEGATIVE   Bilirubin Urine NEGATIVE NEGATIVE   Ketones, ur NEGATIVE NEGATIVE mg/dL   Protein, ur NEGATIVE NEGATIVE mg/dL   Nitrite NEGATIVE NEGATIVE   Leukocytes,Ua NEGATIVE NEGATIVE   RBC / HPF 0-5 0 - 5 RBC/hpf   WBC, UA 0-5 0 - 5 WBC/hpf   Bacteria, UA NONE SEEN NONE SEEN   Squamous Epithelial / LPF NONE SEEN 0 - 5   Mucus PRESENT     Comment: Performed at [facility], [address] [place], [place] [address]  Urine Drug Screen, Qualitative     Status: Abnormal   Collection Time: 08/05/18  1:23 PM  Result Value Ref Range   Tricyclic, Ur Screen NONE DETECTED NONE DETECTED   Amphetamines, Ur Screen NONE DETECTED NONE DETECTED   MDMA (Ecstasy)Ur Screen NONE DETECTED NONE DETECTED   Cocaine Metabolite,Ur [place] NONE DETECTED NONE DETECTED   Opiate, Ur Screen NONE DETECTED NONE DETECTED   Phencyclidine (PCP) Ur S NONE DETECTED NONE DETECTED   Cannabinoid 50 Ng, Ur [place] NONE DETECTED NONE DETECTED   Barbiturates, Ur Screen POSITIVE (A) NONE DETECTED   Benzodiazepine, Ur Scrn NONE DETECTED NONE DETECTED    Methadone Scn, Ur NONE DETECTED NONE DETECTED    Comment: (NOTE) Tricyclics + metabolites, urine    Cutoff 1000 ng/mL Amphetamines + metabolites, urine  Cutoff 1000 ng/mL MDMA (Ecstasy), urine              Cutoff 500 ng/mL Cocaine Metabolite, urine          Cutoff 300 ng/mL Opiate + metabolites, urine        Cutoff 300 ng/mL Phencyclidine (PCP), urine         Cutoff 25 ng/mL Cannabinoid, urine                 Cutoff 50 ng/mL Barbiturates + metabolites, urine  Cutoff 200 ng/mL Benzodiazepine, urine              Cutoff 200 ng/mL Methadone, urine                   Cutoff 300 ng/mL The urine drug screen provides only a preliminary, unconfirmed analytical test result and should not be used for non-medical purposes. Clinical consideration and professional judgment should be applied to any positive drug screen result due to possible interfering substances. A more specific alternate chemical method must be used in order to obtain a confirmed analytical result. Gas chromatography / mass spectrometry (GC/MS) is the preferred confirmat ory method. Performed at [facility], [address] [place], [place] [address]     No current facility-administered medications for this encounter.    Current Outpatient Medications  Medication Sig Dispense Refill  . magic mouthwash w/lidocaine SOLN Take 5 mLs by mouth 4 (four) times daily as needed for mouth pain. 100 mL 0  . predniSONE (DELTASONE) 10 MG tablet Take 1 tablet (10 mg total) by mouth 2 (two) times daily with a meal. 10 tablet 0    Musculoskeletal: Strength & Muscle Tone: within normal limits Gait & Station: normal Patient leans: N/A  Psychiatric Specialty Exam: Physical Exam  Nursing note and vitals reviewed. Constitutional: He appears well-developed and well-nourished. No distress.  HENT:  Head: Normocephalic and atraumatic.  Eyes: EOM are normal.  Neck: Normal range of motion.  Cardiovascular: Normal rate and regular  rhythm.  Respiratory: Effort normal. No respiratory distress.  Musculoskeletal: Normal range of motion.  Neurological: He is alert.    Review of Systems  Psychiatric/Behavioral: Positive for depression, substance abuse and suicidal ideas. Negative for hallucinations and memory loss. The patient is nervous/anxious. The patient does not have insomnia.   All other systems reviewed and are negative.   Blood pressure 132/80, pulse 98, resp. rate (!) 21, height 5\' 9"  (1.753 m), weight 83.9 kg, SpO2 97 %.Body mass index is 27.32 kg/m.  General Appearance: Fairly Groomed  Eye Contact:  Good  Speech:  Clear and Coherent and Normal Rate  Volume:  Normal  Mood:  Dysphoric  Affect:  Congruent  Thought Process:  Descriptions of Associations: Intact  Orientation:  Full (Time, Place, and Person)  Thought Content:  Logical and Hallucinations: None  Suicidal Thoughts:  Yes.  without intent/plan  Homicidal Thoughts:  No  Memory:  fair  Judgement:  Impaired  Insight:  Shallow  Psychomotor Activity:  Normal  Concentration:  Concentration: Fair  Recall:  Fair  Fund of Knowledge:  Fair  Language:  Good  Akathisia:  No  Handed:  Right  AIMS (if indicated):     Assets:  Communication Skills Desire for Improvement Housing Social Support Vocational/Educational  ADL's:  Intact  Cognition:  WNL  Sleep:   Recently hypersomnia     Treatment Plan Summary: Daily contact with patient to assess and evaluate symptoms and progress in treatment and Medication management  Disposition: Recommend psychiatric Inpatient admission when medically cleared. Supportive therapy provided about ongoing stressors. Continue involuntary commitment   Orders placed for admission for inpatient psychiatry.  Mariel Craft, MD 08/05/2018 3:04 PM

## 2018-08-05 NOTE — ED Notes (Signed)
ED  Is the patient under IVC or is there intent for IVC: Yes.   Is the patient medically cleared: Yes.   Is there vacancy in the ED BHU: Yes.   Is the population mix appropriate for patient: Yes.   Is the patient awaiting placement in inpatient or outpatient setting: Yes.   Has the patient had a psychiatric consult: Yes.   Survey of unit performed for contraband, proper placement and condition of furniture, tampering with fixtures in bathroom, shower, and each patient room: Yes.  ; Findings:  APPEARANCE/BEHAVIOR Calm and cooperative NEURO ASSESSMENT Orientation: oriented x3  Denies pain Hallucinations: No.None noted (Hallucinations) denies  Speech: Normal Gait: normal RESPIRATORY ASSESSMENT Even  Unlabored respirations  CARDIOVASCULAR ASSESSMENT Pulses equal   regular rate  Skin warm and dry   GASTROINTESTINAL ASSESSMENT no GI complaint EXTREMITIES Full ROM  PLAN OF CARE Provide calm/safe environment. Vital signs assessed twice daily. ED BHU Assessment once each 12-hour shift. Collaborate with TTS daily or as condition indicates. Assure the ED provider has rounded once each shift. Provide and encourage hygiene. Provide redirection as needed. Assess for escalating behavior; address immediately and inform ED provider.  Assess family dynamic and appropriateness for visitation as needed: Yes.  ; If necessary, describe findings:  Educate the patient/family about BHU procedures/visitation: Yes.  ; If necessary, describe findings:   

## 2018-08-06 ENCOUNTER — Inpatient Hospital Stay: Payer: 59

## 2018-08-06 DIAGNOSIS — R519 Headache, unspecified: Secondary | ICD-10-CM

## 2018-08-06 DIAGNOSIS — F322 Major depressive disorder, single episode, severe without psychotic features: Secondary | ICD-10-CM

## 2018-08-06 LAB — TSH: TSH: 6.724 u[IU]/mL — ABNORMAL HIGH (ref 0.350–4.500)

## 2018-08-06 MED ORDER — CITALOPRAM HYDROBROMIDE 20 MG PO TABS
20.0000 mg | ORAL_TABLET | Freq: Every day | ORAL | Status: DC
  Administered 2018-08-06 – 2018-08-09 (×4): 20 mg via ORAL
  Filled 2018-08-06 (×4): qty 1
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
## 2018-08-06 NOTE — H&P (Signed)
Psychiatric Admission Assessment Adult  Patient Identification: Louis Wade MRN:  [id number] Date of Evaluation:  08/06/2018 Chief Complaint:  Depression Principal Diagnosis: MDD (major depressive disorder), severe (HCC) Diagnosis:  Principal Problem:   MDD (major depressive disorder), severe (HCC) Active Problems:   Suicidal behavior   Alcohol abuse   Thyroid disease   New onset of headaches  History of Present Illness: This is a 33 year old man with no past history of psychiatric treatment who came to the emergency room after becoming extremely intoxicated making some statements to his family that suggested suicidal ideation and being found with partially empty pill bottles nearby when he was passed out.  On interview today the patient says he has no memory of taking the pills but he does remember that his mood had been very bad and down and that he was having some suicidal thoughts.  Patient describes his mood is being chronically bad although he has a hard time describing it.  He says it will sort of go up and down frequently.  He has had passive suicidal thoughts several times without having acted on it.  Patient says he had been sober for about 1 month having decided to make an effort to stop drinking but on the day in question he consumed almost 1/2 a gallon of liquor.  He says he did this because he received some "bad news".  The bad news was simply that he was told he had abnormal thyroid tests and his mother is having some kind of problem that she is in the hospital for.  Patient denies other drug abuse.  He had recently gone to see his primary care doctor complaining of new onset severe daily frontal headaches.  His doctor had given him a prescription for a headache medicine and told him to take it 3 times a day reportedly.  Judging from the barbiturates in his system it appears that he was given a Fioricet type preparation probably. Associated Signs/Symptoms: Depression Symptoms:   depressed mood, psychomotor retardation, impaired memory, suicidal thoughts without plan, (Hypo) Manic Symptoms:  Labiality of Mood, Anxiety Symptoms:  Excessive Worry, Psychotic Symptoms:  None reported PTSD Symptoms: Negative Total Time spent with patient: 1 hour  Past Psychiatric History: Patient reports a history of heavy alcohol abuse but has never been in any sort of treatment either individual or group.  No history of delirium tremens or seizures.  Says he used to also smoke a lot of pot and use other drugs but has cut down on that.  Denies ever having been in a psychiatric hospital or been prescribed any psychiatric medicine in the past.  Denies any past suicide attempts  Is the patient at risk to self? Yes.    Has the patient been a risk to self in the past 6 months? Yes.    Has the patient been a risk to self within the distant past? No.  Is the patient a risk to others? No.  Has the patient been a risk to others in the past 6 months? No.  Has the patient been a risk to others within the distant past? No.   Prior Inpatient Therapy:   Prior Outpatient Therapy:    Alcohol Screening: 1. How often do you have a drink containing alcohol?: Monthly or less 2. How many drinks containing alcohol do you have on a typical day when you are drinking?: 1 or 2 3. How often do you have six or more drinks on one occasion?: Never AUDIT-C  Score: 1 4. How often during the last year have you found that you were not able to stop drinking once you had started?: Never 5. How often during the last year have you failed to do what was normally expected from you becasue of drinking?: Less than monthly 6. How often during the last year have you needed a first drink in the morning to get yourself going after a heavy drinking session?: Never 7. How often during the last year have you had a feeling of guilt of remorse after drinking?: Less than monthly 8. How often during the last year have you been unable  to remember what happened the night before because you had been drinking?: Less than monthly 9. Have you or someone else been injured as a result of your drinking?: No 10. Has a relative or friend or a doctor or another health worker been concerned about your drinking or suggested you cut down?: Yes, during the last year Alcohol Use Disorder Identification Test Final Score (AUDIT): 8 Alcohol Brief Interventions/Follow-up: Alcohol Education, Continued Monitoring Substance Abuse History in the last 12 months:  Yes.   Consequences of Substance Abuse: Medical Consequences:  Possible overdose related to use of barbiturates along with alcohol as well as elevated liver enzymes and worsening mental health issues Previous Psychotropic Medications: No  Psychological Evaluations: No  Past Medical History:  Past Medical History:  Diagnosis Date  . Thyroid disease     Past Surgical History:  Procedure Laterality Date  . TONSILLECTOMY     Family History: History reviewed. No pertinent family history. Family Psychiatric  History: Denies any Tobacco Screening: Have you used any form of tobacco in the last 30 days? (Cigarettes, Smokeless Tobacco, Cigars, and/or Pipes): Yes Tobacco use, Select all that apply: 5 or more cigarettes per day Are you interested in Tobacco Cessation Medications?: Yes, will notify MD for an order Counseled patient on smoking cessation including recognizing danger situations, developing coping skills and basic information about quitting provided: Yes Social History:  Social History   Substance and Sexual Activity  Alcohol Use Yes     Social History   Substance and Sexual Activity  Drug Use Not Currently  . Types: Marijuana    Additional Social History: Marital status: Divorced Divorced, when?: 2018 What types of issues is patient dealing with in the relationship?: Pt reports "I work too much".  Are you sexually active?: Yes What is your sexual orientation?:  Heterosexual Has your sexual activity been affected by drugs, alcohol, medication, or emotional stress?: Pt denies.  Does patient have children?: Yes How many children?: 1 How is patient's relationship with their children?: Pt reports "great".                          Allergies:  No Known Allergies Lab Results:  Results for orders placed or performed during the hospital encounter of 08/05/18 (from the past 48 hour(s))  Acetaminophen level     Status: None   Collection Time: 08/05/18 11:41 AM  Result Value Ref Range   Acetaminophen (Tylenol), Serum 17 10 - 30 ug/mL    Comment: (NOTE) Therapeutic concentrations vary significantly. A range of 10-30 ug/mL  may be an effective concentration for many patients. However, some  are best treated at concentrations outside of this range. Acetaminophen concentrations >150 ug/mL at 4 hours after ingestion  and >50 ug/mL at 12 hours after ingestion are often associated with  toxic reactions. Performed at [facility], [address] [place], [place] [address]   Comprehensive metabolic panel     Status: Abnormal   Collection Time: 08/05/18 11:41 AM  Result Value Ref Range   Sodium 138 135 - 145 mmol/L   Potassium 3.4 (L) 3.5 - 5.1 mmol/L   Chloride 103 98 - 111 mmol/L   CO2 22 22 - 32 mmol/L   Glucose, Bld 111 (H) 70 - 99 mg/dL   BUN 10 6 - 20 mg/dL   Creatinine, Ser 5.67 0.61 - 1.24 mg/dL   Calcium 9.2 8.9 - 01.4 mg/dL   Total Protein 8.6 (H) 6.5 - 8.1 g/dL   Albumin 5.0 3.5 - 5.0 g/dL   AST 65 (H) 15 - 41 U/L   ALT 96 (H) 0 - 44 U/L   Alkaline Phosphatase 76 38 - 126 U/L   Total Bilirubin 0.3 0.3 - 1.2 mg/dL   GFR calc non Af Amer >60 >60 mL/min   GFR calc Af Amer >60 >60 mL/min   Anion gap 13 5 - 15    Comment: Performed at [facility], [address], [place], [place] [address]  Ethanol     Status: Abnormal   Collection Time: 08/05/18 11:41 AM  Result Value Ref Range   Alcohol, Ethyl (B) 75 (H) <10  mg/dL    Comment: (NOTE) Lowest detectable limit for serum alcohol is 10 mg/dL. For medical purposes only. Performed at [facility], [address] [place], [place] [address]   Lipase, blood     Status: None   Collection Time: 08/05/18 11:41 AM  Result Value Ref Range   Lipase 26 11 - 51 U/L    Comment: Performed at [facility], [address] [place], [place] [address]  Salicylate level     Status: None   Collection Time: 08/05/18 11:41 AM  Result Value Ref Range   Salicylate Lvl <7.0 2.8 - 30.0 mg/dL    Comment: Performed at [facility], [address] [place], [place] [address]  CBC with Differential     Status: None   Collection Time: 08/05/18 11:41 AM  Result Value Ref Range   WBC 5.8 4.0 - 10.5 K/uL   RBC 4.52 4.22 - 5.81 MIL/uL   Hemoglobin 15.3 13.0 - 17.0 g/dL   HCT 06.0 15.6 - 15.3 %   MCV 95.8 80.0 - 100.0 fL   MCH 33.8 26.0 - 34.0 pg   MCHC 35.3 30.0 - 36.0 g/dL   RDW 79.4 32.7 - 61.4 %   Platelets 317 150 - 400 K/uL   nRBC 0.0 0.0 - 0.2 %   Neutrophils Relative % 53 %   Neutro Abs 3.1 1.7 - 7.7 K/uL   Lymphocytes Relative 34 %   Lymphs Abs 2.0 0.7 - 4.0 K/uL   Monocytes Relative 9 %   Monocytes Absolute 0.5 0.1 - 1.0 K/uL   Eosinophils Relative 2 %   Eosinophils Absolute 0.1 0.0 - 0.5 K/uL   Basophils Relative 1 %   Basophils Absolute 0.0 0.0 - 0.1 K/uL   Immature Granulocytes 1 %   Abs Immature Granulocytes 0.04 0.00 - 0.07 K/uL    Comment: Performed at [facility], [address] [place], [place] [address]  Urinalysis, Complete w Microscopic     Status: Abnormal   Collection Time: 08/05/18  1:23 PM  Result Value Ref Range   Color, Urine YELLOW (A) YELLOW   APPearance CLEAR (A) CLEAR   Specific Gravity,  Urine 1.020 1.005 - 1.030   pH 6.0 5.0 - 8.0   Glucose, UA NEGATIVE NEGATIVE mg/dL   Hgb urine dipstick NEGATIVE NEGATIVE   Bilirubin Urine NEGATIVE NEGATIVE   Ketones, ur NEGATIVE NEGATIVE mg/dL    Protein, ur NEGATIVE NEGATIVE mg/dL   Nitrite NEGATIVE NEGATIVE   Leukocytes,Ua NEGATIVE NEGATIVE   RBC / HPF 0-5 0 - 5 RBC/hpf   WBC, UA 0-5 0 - 5 WBC/hpf   Bacteria, UA NONE SEEN NONE SEEN   Squamous Epithelial / LPF NONE SEEN 0 - 5   Mucus PRESENT     Comment: Performed at [facility], [address], [place], [place] [address]  Urine Drug Screen, Qualitative     Status: Abnormal   Collection Time: 08/05/18  1:23 PM  Result Value Ref Range   Tricyclic, Ur Screen NONE DETECTED NONE DETECTED   Amphetamines, Ur Screen NONE DETECTED NONE DETECTED   MDMA (Ecstasy)Ur Screen NONE DETECTED NONE DETECTED   Cocaine Metabolite,Ur [place] NONE DETECTED NONE DETECTED   Opiate, Ur Screen NONE DETECTED NONE DETECTED   Phencyclidine (PCP) Ur S NONE DETECTED NONE DETECTED   Cannabinoid 50 Ng, Ur [place] NONE DETECTED NONE DETECTED   Barbiturates, Ur Screen POSITIVE (A) NONE DETECTED   Benzodiazepine, Ur Scrn NONE DETECTED NONE DETECTED   Methadone Scn, Ur NONE DETECTED NONE DETECTED    Comment: (NOTE) Tricyclics + metabolites, urine    Cutoff 1000 ng/mL Amphetamines + metabolites, urine  Cutoff 1000 ng/mL MDMA (Ecstasy), urine              Cutoff 500 ng/mL Cocaine Metabolite, urine          Cutoff 300 ng/mL Opiate + metabolites, urine        Cutoff 300 ng/mL Phencyclidine (PCP), urine         Cutoff 25 ng/mL Cannabinoid, urine                 Cutoff 50 ng/mL Barbiturates + metabolites, urine  Cutoff 200 ng/mL Benzodiazepine, urine              Cutoff 200 ng/mL Methadone, urine                   Cutoff 300 ng/mL The urine drug screen provides only a preliminary, unconfirmed analytical test result and should not be used for non-medical purposes. Clinical consideration and professional judgment should be applied to any positive drug screen result due to possible interfering substances. A more specific alternate chemical method must be used in order to obtain a confirmed analytical result. Gas  chromatography / mass spectrometry (GC/MS) is the preferred confirmat ory method. Performed at [facility], [address] [place], [place] [address]   SARS Coronavirus 2 (CEPHEID - Performed in [facility] hospital lab), Hosp Order     Status: None   Collection Time: 08/05/18  4:36 PM  Result Value Ref Range   SARS Coronavirus 2 NEGATIVE NEGATIVE    Comment: (NOTE) If result is NEGATIVE SARS-CoV-2 target nucleic acids are NOT DETECTED. The SARS-CoV-2 RNA is generally detectable in upper and lower  respiratory specimens during the acute phase of infection. The lowest  concentration of SARS-CoV-2 viral copies this assay can detect is 250  copies / mL. A negative result does not preclude SARS-CoV-2 infection  and should not be used as the sole basis for treatment or other  patient management decisions.  A negative result may occur with  improper specimen collection /  handling, submission of specimen other  than nasopharyngeal swab, presence of viral mutation(s) within the  areas targeted by this assay, and inadequate number of viral copies  (<250 copies / mL). A negative result must be combined with clinical  observations, patient history, and epidemiological information. If result is POSITIVE SARS-CoV-2 target nucleic acids are DETECTED. The SARS-CoV-2 RNA is generally detectable in upper and lower  respiratory specimens dur ing the acute phase of infection.  Positive  results are indicative of active infection with SARS-CoV-2.  Clinical  correlation with patient history and other diagnostic information is  necessary to determine patient infection status.  Positive results do  not rule out bacterial infection or co-infection with other viruses. If result is PRESUMPTIVE POSTIVE SARS-CoV-2 nucleic acids MAY BE PRESENT.   A presumptive positive result was obtained on the submitted specimen  and confirmed on repeat testing.  While 2019 novel coronavirus  (SARS-CoV-2)  nucleic acids may be present in the submitted sample  additional confirmatory testing may be necessary for epidemiological  and / or clinical management purposes  to differentiate between  SARS-CoV-2 and other Sarbecovirus currently known to infect humans.  If clinically indicated additional testing with an alternate test  methodology [id number]) is advised. The SARS-CoV-2 RNA is generally  detectable in upper and lower respiratory sp ecimens during the acute  phase of infection. The expected result is Negative. Fact Sheet for Patients:  BoilerBrush.com.cy Fact Sheet for Healthcare Providers: https://pope.com/ This test is not yet approved or cleared by the [place] FDA and has been authorized for detection and/or diagnosis of SARS-CoV-2 by FDA under an Emergency Use Authorization (EUA).  This EUA will remain in effect (meaning this test can be used) for the duration of the COVID-19 declaration under Section 564(b)(1) of the Act, 21 U.S.C. section 360bbb-3(b)(1), unless the authorization is terminated or revoked sooner. Performed at [facility], [address] [place], [place] [address]     Blood Alcohol level:  Lab Results  Component Value Date   ETH 110 (H) 08/05/2018    Metabolic Disorder Labs:  No results found for: HGBA1C, MPG No results found for: PROLACTIN No results found for: CHOL, TRIG, HDL, CHOLHDL, VLDL, LDLCALC  Current Medications: Current Facility-Administered Medications  Medication Dose Route Frequency Provider Last Rate Last Dose  . acetaminophen (TYLENOL) tablet 650 mg  650 mg Oral Q6H PRN Mariel Craft, MD      . alum & mag hydroxide-simeth (MAALOX/MYLANTA) 200-200-20 MG/5ML suspension 30 mL  30 mL Oral Q4H PRN Mariel Craft, MD      . citalopram (CELEXA) tablet 20 mg  20 mg Oral Daily Clapacs, Jackquline Denmark, MD   20 mg at 08/06/18 1605  . diphenhydrAMINE (BENADRYL) capsule 50 mg  50 mg Oral Q6H  PRN Mariel Craft, MD      . risperiDONE (RISPERDAL M-TABS) disintegrating tablet 2 mg  2 mg Oral Q8H PRN Mariel Craft, MD       And  . LORazepam (ATIVAN) tablet 1 mg  1 mg Oral PRN Mariel Craft, MD       And  . ziprasidone (GEODON) injection 20 mg  20 mg Intramuscular PRN Mariel Craft, MD      . magnesium hydroxide (MILK OF MAGNESIA) suspension 30 mL  30 mL Oral Daily PRN Mariel Craft, MD      . nicotine (NICODERM CQ - dosed in mg/24 hours) patch 14 mg  14 mg Transdermal Daily Clapacs,  Jackquline DenmarkJohn T, MD   14 mg at 08/06/18 [id number]   PTA Medications: Medications Prior to Admission  Medication Sig Dispense Refill Last Dose  . butalbital-acetaminophen-caffeine (FIORICET) 50-325-40 MG tablet TK 1 T PO TID PRN     . levothyroxine (SYNTHROID) 50 MCG tablet Take 50 mcg by mouth daily.     . magic mouthwash w/lidocaine SOLN Take 5 mLs by mouth 4 (four) times daily as needed for mouth pain. (Patient not taking: Reported on 08/05/2018) 100 mL 0 Completed Course at Unknown time  . predniSONE (DELTASONE) 10 MG tablet Take 1 tablet (10 mg total) by mouth 2 (two) times daily with a meal. (Patient not taking: Reported on 08/05/2018) 10 tablet 0 Completed Course at Unknown time  . SUMAtriptan (IMITREX) 100 MG tablet TK 1 T PO BID PRN       Musculoskeletal: Strength & Muscle Tone: within normal limits Gait & Station: normal Patient leans: N/A  Psychiatric Specialty Exam: Physical Exam  Nursing note and vitals reviewed. Constitutional: He appears well-developed and well-nourished.  HENT:  Head: Normocephalic and atraumatic.  Eyes: Pupils are equal, round, and reactive to light. Conjunctivae are normal.  Neck: Normal range of motion.  Cardiovascular: Regular rhythm and normal heart sounds.  Respiratory: Effort normal.  GI: Soft.  Musculoskeletal: Normal range of motion.  Neurological: He is alert.  Skin: Skin is warm and dry.  Psychiatric: His affect is blunt. His speech is delayed. He is  slowed. He expresses inappropriate judgment. He expresses suicidal ideation. He expresses no suicidal plans. He exhibits abnormal recent memory.    Review of Systems  Constitutional: Negative.   HENT: Negative.   Eyes: Negative.   Respiratory: Negative.   Cardiovascular: Negative.   Gastrointestinal: Negative.   Musculoskeletal: Negative.   Skin: Negative.   Neurological: Negative.   Psychiatric/Behavioral: Positive for depression, memory loss, substance abuse and suicidal ideas. Negative for hallucinations. The patient is nervous/anxious. The patient does not have insomnia.     Blood pressure 131/78, pulse 74, temperature 97.8 F (36.6 C), temperature source Oral, resp. rate 17, height 5\' 9"  (1.753 m), weight 85.5 kg, SpO2 100 %.Body mass index is 27.84 kg/m.  General Appearance: Casual  Eye Contact:  Fair  Speech:  Slow  Volume:  Decreased  Mood:  Dysphoric  Affect:  Congruent  Thought Process:  Goal Directed  Orientation:  Full (Time, Place, and Person)  Thought Content:  Logical  Suicidal Thoughts:  Yes.  without intent/plan  Homicidal Thoughts:  No  Memory:  Immediate;   Fair Recent;   Poor Remote;   Fair  Judgement:  Fair  Insight:  Fair  Psychomotor Activity:  Decreased  Concentration:  Concentration: Fair  Recall:  FiservFair  Fund of Knowledge:  Fair  Language:  Fair  Akathisia:  No  Handed:  Right  AIMS (if indicated):     Assets:  Communication Skills Desire for Improvement Housing Physical Health Resilience Social Support  ADL's:  Intact  Cognition:  WNL  Sleep:  Number of Hours: 6.15    Treatment Plan Summary: Daily contact with patient to assess and evaluate symptoms and progress in treatment, Medication management and Plan Patient has orders in place for detox although he does not appear to be having any significant withdrawal symptoms.  He has no history of DTs.  We talked about his mood symptoms.  It is a little hard to judge from how he is vaguely  describing it whether he has more of a depressive  or some sort of bipolar type condition.  Also in the differential diagnosis would be ADHD and just the effects of substance abuse and even possibly PTSD.  Patient does not appear to be psychotic.  He is cooperative with treatment options.  I am going to get a head CT done because of his report of new onset severe headaches.  I did some psychoeducation about how Fioricet is not a very safe medicine because of the barbiturate content.  I suggest starting a modest dose of a simple antidepressant such as citalopram and he was agreeable.  Patient will be included in groups and activities.  We have a TSH pending at which point we can see whether he does need to be on any medicine for his thyroid.  Likely length of stay 2 to 3 days.  Observation Level/Precautions:  15 minute checks  Laboratory:  TSH  Psychotherapy:    Medications:    Consultations:    Discharge Concerns:    Estimated LOS:  Other:     Physician Treatment Plan for Primary Diagnosis: MDD (major depressive disorder), severe (HCC) Long Term Goal(s): Improvement in symptoms so as ready for discharge  Short Term Goals: Ability to verbalize feelings will improve, Ability to disclose and discuss suicidal ideas and Ability to demonstrate self-control will improve  Physician Treatment Plan for Secondary Diagnosis: Principal Problem:   MDD (major depressive disorder), severe (HCC) Active Problems:   Suicidal behavior   Alcohol abuse   Thyroid disease   New onset of headaches  Long Term Goal(s): Improvement in symptoms so as ready for discharge  Short Term Goals: Compliance with prescribed medications will improve and Ability to identify triggers associated with substance abuse/mental health issues will improve  I certify that inpatient services furnished can reasonably be expected to improve the patient's condition.    Mordecai Rasmussen, MD 6/5/20204:32 PM

## 2018-08-06 NOTE — Progress Notes (Signed)
Recreation Therapy Notes   Date: 08/06/2018  Time: 9:30 am  Location: Outside  Behavioral response: Appropriate  Intervention Topic: Relaxation  Discussion/Intervention:  Group content today was focused on relaxation. The group defined relaxation and identified healthy ways to relax. Individuals expressed how much time they spend relaxing. Patients expressed how much their life would be if they did not make time for themselves to relax. The group stated ways they could improve their relaxation techniques in the future.  Individuals participated in the intervention "Time to Relax" where they had a chance to experience different relaxation techniques.  Clinical Observations/Feedback:  Patient came to group late and stated that he would like participate in guided imagery again in the future. Individual was social with peers and staff while participating in the intervention. Nehemias Sauceda LRT/CTRS         Edris Schneck 08/06/2018 11:36 AM

## 2018-08-06 NOTE — [facility] Suicide Risk Assessment (Signed)
[facility] Admission Suicide Risk Assessment   Nursing information obtained from:  Patient, Review of record Demographic factors:  Male, Divorced or widowed, Caucasian, Living alone Current Mental Status:  NA Loss Factors:  Loss of significant relationship Historical Factors:  Prior suicide attempts Risk Reduction Factors:  Responsible for children under 31 years of age, Sense of responsibility to family, Employed  Total Time spent with patient: 1 hour Principal Problem: <principal problem not specified> Diagnosis:  Active Problems:   Suicidal behavior  Subjective Data: Young man who was admitted to the hospital after presenting intoxicated with evidence of possible overdose.  Patient continues to report chronic mood lability lots of mood swings up and down feelings of irritability and sadness at times.  Denies suicidal intent but has vague occasional still feelings of hopelessness.  Denies any current psychotic symptoms.  No specific physical complaints.  Cooperative with treatment.  Continued Clinical Symptoms:  Alcohol Use Disorder Identification Test Final Score (AUDIT): 8 The "Alcohol Use Disorders Identification Test", Guidelines for Use in Primary Care, Second Edition.  World Science writer [facility]). Score between 0-7:  no or low risk or alcohol related problems. Score between 8-15:  moderate risk of alcohol related problems. Score between 16-19:  high risk of alcohol related problems. Score 20 or above:  warrants further diagnostic evaluation for alcohol dependence and treatment.   CLINICAL FACTORS:   Depression:   Comorbid alcohol abuse/dependence Alcohol/Substance Abuse/Dependencies   Musculoskeletal: Strength & Muscle Tone: within normal limits Gait & Station: normal Patient leans: N/A  Psychiatric Specialty Exam: Physical Exam  Nursing note and vitals reviewed. Constitutional: He appears well-developed and well-nourished.  HENT:  Head: Normocephalic and atraumatic.   Eyes: Pupils are equal, round, and reactive to light. Conjunctivae are normal.  Neck: Normal range of motion.  Cardiovascular: Regular rhythm and normal heart sounds.  Respiratory: Effort normal. No respiratory distress.  GI: Soft.  Musculoskeletal: Normal range of motion.  Neurological: He is alert.  Skin: Skin is warm and dry.  Psychiatric: He has a normal mood and affect. His speech is normal and behavior is normal. He is not agitated. Thought content is not paranoid. Cognition and memory are normal. He expresses impulsivity. He expresses no homicidal and no suicidal ideation.    Review of Systems  Constitutional: Negative.   HENT: Negative.   Eyes: Negative.   Respiratory: Negative.   Cardiovascular: Negative.   Gastrointestinal: Negative.   Musculoskeletal: Negative.   Skin: Negative.   Neurological: Negative.   Psychiatric/Behavioral: Positive for depression, memory loss and substance abuse. Negative for hallucinations and suicidal ideas. The patient is nervous/anxious and has insomnia.     Blood pressure 131/78, pulse 74, temperature 97.8 F (36.6 C), temperature source Oral, resp. rate 17, height 5\' 9"  (1.753 m), weight 85.5 kg, SpO2 100 %.Body mass index is 27.84 kg/m.  General Appearance: Casual  Eye Contact:  Good  Speech:  Slow  Volume:  Decreased  Mood:  Dysphoric  Affect:  Congruent  Thought Process:  Goal Directed  Orientation:  Full (Time, Place, and Person)  Thought Content:  Logical  Suicidal Thoughts:  Yes.  without intent/plan  Homicidal Thoughts:  No  Memory:  Immediate;   Fair Recent;   Fair Remote;   Fair  Judgement:  Fair  Insight:  Fair  Psychomotor Activity:  Decreased  Concentration:  Concentration: Fair  Recall:  [employer] of Knowledge:  Fair  Language:  Fair  Akathisia:  No  Handed:  Right  AIMS (  if indicated):     Assets:  Communication Skills Desire for Improvement Housing  ADL's:  Intact  Cognition:  WNL  Sleep:  Number of  Hours: 6.15      COGNITIVE FEATURES THAT CONTRIBUTE TO RISK:  Loss of executive function    SUICIDE RISK:   Mild:  Suicidal ideation of limited frequency, intensity, duration, and specificity.  There are no identifiable plans, no associated intent, mild dysphoria and related symptoms, good self-control (both objective and subjective assessment), few other risk factors, and identifiable protective factors, including available and accessible social support.  PLAN OF CARE: 15-minute checks will be continued.  Start patient on low-dose antidepressant.  Monitor for any signs of withdrawal.  Engage in individual and group counseling and assessment.  Arrange for appropriate discharge planning when he is ready to go.  I certify that inpatient services furnished can reasonably be expected to improve the patient's condition.   Mordecai RasmussenJohn Tafari Humiston, MD 08/06/2018, 4:29 PM

## 2018-08-06 NOTE — [facility] Suicide Risk Assessment (Signed)
[facility] INPATIENT:  Family/Significant Other Suicide Prevention Education  Suicide Prevention Education:  Patient Refusal for Family/Significant Other Suicide Prevention Education: The patient VENANCIO HASMAN has refused to provide written consent for family/significant other to be provided Family/Significant Other Suicide Prevention Education during admission and/or prior to discharge.  Physician notified.  SPE completed with pt, as pt refused to consent to family contact. SPI pamphlet provided to pt and pt was encouraged to share information with support network, ask questions, and talk about any concerns relating to SPE. Pt denies access to guns/firearms and verbalized understanding of information provided. Mobile Crisis information also provided to pt.  CSW notes that patient has consented for contact with sister, however, he does not know his sister's number to provide to CSW.  Review of the patient's chart did not provide possible contact information for sister.    Harden Mo 08/06/2018, 10:31 AM

## 2018-08-06 NOTE — [facility] Group Notes (Signed)
LCSW Group Therapy Note  08/06/2018 1:00 PM  Type of Therapy and Topic:  Group Therapy:  Feelings around Relapse and Recovery  Participation Level:  Active   Description of Group:    Patients in this group will discuss emotions they experience before and after a relapse. They will process how experiencing these feelings, or avoidance of experiencing them, relates to having a relapse. Facilitator will guide patients to explore emotions they have related to recovery. Patients will be encouraged to process which emotions are more powerful. They will be guided to discuss the emotional reaction significant others in their lives may have to their relapse or recovery. Patients will be assisted in exploring ways to respond to the emotions of others without this contributing to a relapse.  Therapeutic Goals: 1. Patient will identify two or more emotions that lead to a relapse for them 2. Patient will identify two emotions that result when they relapse 3. Patient will identify two emotions related to recovery 4. Patient will demonstrate ability to communicate their needs through discussion and/or role plays   Summary of Patient Progress: Patient was present and an active participant in group.  Patient engaged in discussions.  Patient discussed how feelings of being worthless, and not having a sold support system can be a trigger for relapse. Patient discussed how following a relapse he has felt anger, guilt and shame.  Patient shared that his goal of continue on a good path is a motivator for him.  Patient was able to identify several coping skills including going outside and staying busy.    Therapeutic Modalities:   Cognitive Behavioral Therapy Solution-Focused Therapy Assertiveness Training Relapse Prevention Therapy   Penni Homans, MSW, LCSW 08/06/2018 12:46 PM

## 2018-08-06 NOTE — Plan of Care (Signed)
New patient, admitted on the unit last night. Patient affect is anxious and worried. Patient inquisitive about the unit and the process of discharging. RN informed patient about participating in groups, talking with social workers and physician. Patient denies HI, AVH, but positive for Passive SI. Patient does contract for safety on the unit. Safety checks Q 15 minutes to continue. Problem: Education: Goal: Emotional status will improve Outcome: Not Progressing Goal: Mental status will improve Outcome: Not Progressing   Problem: Health Behavior/Discharge Planning: Goal: Compliance with treatment plan for underlying cause of condition will improve Outcome: Not Progressing   Problem: Safety: Goal: Periods of time without injury will increase Outcome: Not Progressing   Problem: Coping: Goal: Coping ability will improve Outcome: Not Progressing   Problem: Safety: Goal: Ability to disclose and discuss suicidal ideas will improve Outcome: Not Progressing

## 2018-08-06 NOTE — Tx Team (Addendum)
Interdisciplinary Treatment and Diagnostic Plan Update  08/06/2018 Time of Session: 2:30pm Louis Wade MRN: [id number]  Principal Diagnosis: <principal problem not specified>  Secondary Diagnoses: Active Problems:   Suicidal behavior   Current Medications:  Current Facility-Administered Medications  Medication Dose Route Frequency Provider Last Rate Last Dose  . acetaminophen (TYLENOL) tablet 650 mg  650 mg Oral Q6H PRN Mariel Craft, MD      . alum & mag hydroxide-simeth (MAALOX/MYLANTA) 200-200-20 MG/5ML suspension 30 mL  30 mL Oral Q4H PRN Mariel Craft, MD      . citalopram (CELEXA) tablet 20 mg  20 mg Oral Daily Clapacs, John T, MD      . diphenhydrAMINE (BENADRYL) capsule 50 mg  50 mg Oral Q6H PRN Mariel Craft, MD      . risperiDONE (RISPERDAL M-TABS) disintegrating tablet 2 mg  2 mg Oral Q8H PRN Mariel Craft, MD       And  . LORazepam (ATIVAN) tablet 1 mg  1 mg Oral PRN Mariel Craft, MD       And  . ziprasidone (GEODON) injection 20 mg  20 mg Intramuscular PRN Mariel Craft, MD      . magnesium hydroxide (MILK OF MAGNESIA) suspension 30 mL  30 mL Oral Daily PRN Mariel Craft, MD      . nicotine (NICODERM CQ - dosed in mg/24 hours) patch 14 mg  14 mg Transdermal Daily Clapacs, Jackquline Denmark, MD   14 mg at 08/06/18 2353   PTA Medications: Medications Prior to Admission  Medication Sig Dispense Refill Last Dose  . butalbital-acetaminophen-caffeine (FIORICET) 50-325-40 MG tablet TK 1 T PO TID PRN     . levothyroxine (SYNTHROID) 50 MCG tablet Take 50 mcg by mouth daily.     . magic mouthwash w/lidocaine SOLN Take 5 mLs by mouth 4 (four) times daily as needed for mouth pain. (Patient not taking: Reported on 08/05/2018) 100 mL 0 Completed Course at Unknown time  . predniSONE (DELTASONE) 10 MG tablet Take 1 tablet (10 mg total) by mouth 2 (two) times daily with a meal. (Patient not taking: Reported on 08/05/2018) 10 tablet 0 Completed Course at Unknown time  .  SUMAtriptan (IMITREX) 100 MG tablet TK 1 T PO BID PRN       Patient Stressors: Marital or family conflict Substance abuse  Patient Strengths: Ability for insight Average or above average intelligence Capable of independent living Metallurgist fund of knowledge Motivation for treatment/growth Physical Health Special hobby/interest Supportive family/friends Work skills  Treatment Modalities: Medication Management, Group therapy, Case management,  1 to 1 session with clinician, Psychoeducation, Recreational therapy.   Physician Treatment Plan for Primary Diagnosis: <principal problem not specified> Long Term Goal(s):     Short Term Goals:    Medication Management: Evaluate patient's response, side effects, and tolerance of medication regimen.  Therapeutic Interventions: 1 to 1 sessions, Unit Group sessions and Medication administration.  Evaluation of Outcomes: Progressing  Physician Treatment Plan for Secondary Diagnosis: Active Problems:   Suicidal behavior  Long Term Goal(s):     Short Term Goals:       Medication Management: Evaluate patient's response, side effects, and tolerance of medication regimen.  Therapeutic Interventions: 1 to 1 sessions, Unit Group sessions and Medication administration.  Evaluation of Outcomes: Progressing   RN Treatment Plan for Primary Diagnosis: <principal problem not specified> Long Term Goal(s): Knowledge of disease and therapeutic regimen to maintain health will improve  Short Term Goals: Ability to demonstrate self-control, Ability to participate in decision making will improve, Ability to verbalize feelings will improve, Ability to disclose and discuss suicidal ideas and Ability to identify and develop effective coping behaviors will improve  Medication Management: RN will administer medications as ordered by provider, will assess and evaluate patient's response and provide education to patient  for prescribed medication. RN will report any adverse and/or side effects to prescribing provider.  Therapeutic Interventions: 1 on 1 counseling sessions, Psychoeducation, Medication administration, Evaluate responses to treatment, Monitor vital signs and CBGs as ordered, Perform/monitor CIWA, COWS, AIMS and Fall Risk screenings as ordered, Perform wound care treatments as ordered.  Evaluation of Outcomes: Progressing   LCSW Treatment Plan for Primary Diagnosis: <principal problem not specified> Long Term Goal(s): Safe transition to appropriate next level of care at discharge, Engage patient in therapeutic group addressing interpersonal concerns.  Short Term Goals: Engage patient in aftercare planning with referrals and resources, Increase social support, Increase ability to appropriately verbalize feelings, Increase emotional regulation, Facilitate acceptance of mental health diagnosis and concerns and Facilitate patient progression through stages of change regarding substance use diagnoses and concerns  Therapeutic Interventions: Assess for all discharge needs, 1 to 1 time with Social worker, Explore available resources and support systems, Assess for adequacy in community support network, Educate family and significant other(s) on suicide prevention, Complete Psychosocial Assessment, Interpersonal group therapy.  Evaluation of Outcomes: Progressing   Progress in Treatment: Attending groups: Yes. Participating in groups: Yes. Taking medication as prescribed: Yes. Toleration medication: Yes. Family/Significant other contact made: No, will contact:  Pt reports that he doesn't know any contact information for family or peers at this time.  Patient understands diagnosis: Yes. Discussing patient identified problems/goals with staff: Yes. Medical problems stabilized or resolved: Yes. Denies suicidal/homicidal ideation: Yes. Issues/concerns per patient self-inventory: No. Other: none  New  problem(s) identified: No, Describe:  none  New Short Term/Long Term Goal(s): detox, medication management for mood stabilization; elimination of SI thoughts; development of comprehensive mental wellness/sobriety plan.  Patient Goals:  "get my foot in the door to go in a good direction"  Discharge Plan or Barriers: Patient reports plans to be discharged to his home. [facility]  Reason for Continuation of Hospitalization: Anxiety Depression Medication stabilization Suicidal ideation  Estimated Length of Stay: 1-5 days    Attendees: Patient: Louis PhenixDustin Wade 08/06/2018 3:32 PM  Physician: Dr. Toni Amendlapacs, MD 08/06/2018 3:32 PM  Nursing:  08/06/2018 3:32 PM  RN Care Manager: 08/06/2018 3:32 PM  Social Worker: Penni HomansMichaela Stanfield, LCSW 08/06/2018 3:32 PM  Recreational Therapist: Hilbert BibleShay Marlena Barbato, CTRS, LRT 08/06/2018 3:32 PM  Other: Lowella Dandyarren Livingston, LCSW 08/06/2018 3:32 PM  Other:  08/06/2018 3:32 PM  Other: 08/06/2018 3:32 PM    Scribe for Treatment Team: Harden MoMichaela J Stanfield, LCSW 08/06/2018 3:32 PM

## 2018-08-06 NOTE — Progress Notes (Signed)
Recreation Therapy Notes  INPATIENT RECREATION THERAPY ASSESSMENT  Patient Details Name: Louis Wade MRN: [id number] DOB: 17-Jun-1985 Today's Date: 08/06/2018       Information Obtained From: Patient  Able to Participate in Assessment/Interview: Yes  Patient Presentation: Responsive  Reason for Admission (Per Patient): Active Symptoms  Patient Stressors:    Coping Skills:   Substance Abuse, Other (Comment)(being outside)  Leisure Interests (2+):  Sports - Golf, Therapist, art, Social - Family  Frequency of Recreation/Participation: Chief Executive Officer of Community Resources:     [employer]:     Current Use:    If no, Barriers?:    Expressed Interest in [employer] Information:    [employer] of Residence:  Film/video editor  Patient Main Form of Transportation: Set designer  Patient Strengths:  Hard working  Patient Identified Areas of Improvement:  time management  Patient Goal for Hospitalization:  To get a foot in the right direction   Current SI (including self-harm):  No  Current HI:  No  Current AVH: No  Staff Intervention Plan: Group Attendance, Collaborate with Interdisciplinary Treatment Team  Consent to Intern Participation: N/A  Vu Liebman 08/06/2018, 2:12 PM

## 2018-08-07 NOTE — Plan of Care (Signed)
  Problem: Education: Goal: Emotional status will improve Outcome: Progressing Goal: Mental status will improve Outcome: Progressing  D: Patient has been quiet and cooperative. Not voicing any suicidal ideation. Denies complaints. In no acute distress. A: Continue to monitor for safety. R: Safety maintained.

## 2018-08-07 NOTE — Plan of Care (Signed)
Patient  instructed on [facility] Education and unit programing , able  to verbalize understanding . Patient working on coping , decision making and  anxiety  issues.  Patient able to maintained  good eye contact.   Compliant with medication  emotional and mental status improving. No  Safety concerns    Problem: Education: Goal: Emotional status will improve Outcome: Progressing Goal: Mental status will improve Outcome: Progressing   Problem: Health Behavior/Discharge Planning: Goal: Compliance with treatment plan for underlying cause of condition will improve Outcome: Progressing   Problem: Safety: Goal: Periods of time without injury will increase Outcome: Progressing   Problem: Coping: Goal: Coping ability will improve Outcome: Progressing   Problem: Safety: Goal: Ability to disclose and discuss suicidal ideas will improve Outcome: Progressing

## 2018-08-07 NOTE — [facility] Group Notes (Signed)
LCSW Group Therapy Note  08/07/2018 1:15pm  Type of Therapy and Topic:  Group Therapy:  Healthy Self Image and Positive Change  Participation Level:  Active   Description of Group:  In this group, patients will compare and contrast their current "I am...." statements to the visions they identify as desirable for their lives.  Patients discuss fears and how they can make positive changes in their cognitions that will positively impact their behaviors.  Facilitator played a motivational 3-minute speech and patients were left with the task of thinking about what "I am...." statements they can start using in their lives immediately.  Therapeutic Goals: 1. Patient will state their current self-perception as expressed in an "I Am" statement 2. Patient will contrast this with their desired vision for their live 3. Patient will identify 3 fears that negatively impact their behavior 4. Patient will discuss cognitive distortions that stem from their fears 5. Patient will verbalize statements that challenge their cognitive distortions  Summary of Patient Progress:  The patient scored his mood at a 7(10 best.) The patient stated, "I am hardworking" Patient discussed his fears and how he can make positive changes in their cognitions that will positively impact his behaviors. Patient was able to discuss and process cognitive distortions that stem from his  fears. Patient actively and appropriately engaged in the group. Patient was able to provide support and validation to other group members. Patient practiced active listening when interacting with the facilitator and other group members.      Therapeutic Modalities Cognitive Behavioral Therapy Motivational Interviewing  Emon Lance  CUEBAS-COLON, LCSW 08/07/2018 12:50 PM

## 2018-08-07 NOTE — Progress Notes (Signed)
D: Patient has been quiet and cooperative. Not voicing any suicidal ideation. Denies complaints. In no acute distress. A: Continue to monitor for safety. R: Safety maintained.

## 2018-08-07 NOTE — Progress Notes (Signed)
[facility]  08/07/2018 8:30 AM Louis HolmDustin L Haberer  MRN:  [id number] Subjective:   Patient reports that he binged for only 1 day he had been clean for an entire month prior to that relapse and he is pleased that he caught himself before he drifted back into an alcoholic relapse he also is pleased to be informed that his CT scan of the head is negative.  He is alert and oriented fully and cooperative believes the medication thus far is helpful.  Denies wanting to harm himself and can contract.  No cravings tremors or withdrawal and again no thoughts of harming self or others at this point in time Principal Problem: MDD (major depressive disorder), severe (HCC) Diagnosis: Principal Problem:   MDD (major depressive disorder), severe (HCC) Active Problems:   Suicidal behavior   Alcohol abuse   Thyroid disease   New onset of headaches  Total Time spent with patient: 20 minutes  Past Psychiatric History: as above ETOH dep and MDD  Past Medical History:  Past Medical History:  Diagnosis Date  . Thyroid disease     Past Surgical History:  Procedure Laterality Date  . TONSILLECTOMY     Family History: History reviewed. No pertinent family history. Family Psychiatric  History: no new data Social History:  Social History   Substance and Sexual Activity  Alcohol Use Yes     Social History   Substance and Sexual Activity  Drug Use Not Currently  . Types: Marijuana    Social History   Socioeconomic History  . Marital status: Single    Spouse name: Not on file  . Number of children: Not on file  . Years of education: Not on file  . Highest education level: Not on file  Occupational History  . Not on file  Social Needs  . Financial resource strain: Not on file  . Food insecurity:    Worry: Not on file    Inability: Not on file  . Transportation needs:    Medical: Not on file    Non-medical: Not on file  Tobacco Use  . Smoking status: Current Every Day Smoker   Packs/day: 0.50    Types: Cigarettes  . Smokeless tobacco: Never Used  Substance and Sexual Activity  . Alcohol use: Yes  . Drug use: Not Currently    Types: Marijuana  . Sexual activity: Not on file  Lifestyle  . Physical activity:    Days per week: Not on file    Minutes per session: Not on file  . Stress: Not on file  Relationships  . Social connections:    Talks on phone: Not on file    Gets together: Not on file    Attends religious service: Not on file    Active member of club or organization: Not on file    Attends meetings of clubs or organizations: Not on file    Relationship status: Not on file  Other Topics Concern  . Not on file  Social History Narrative  . Not on file   Additional Social History:                         Sleep: Fair  Appetite:  Fair  Current Medications: Current Facility-Administered Medications  Medication Dose Route Frequency Provider Last Rate Last Dose  . acetaminophen (TYLENOL) tablet 650 mg  650 mg Oral Q6H PRN Mariel CraftMaurer, Sheila M, MD      . alum &  mag hydroxide-simeth (MAALOX/MYLANTA) 200-200-20 MG/5ML suspension 30 mL  30 mL Oral Q4H PRN Lavella Hammock, MD      . citalopram (CELEXA) tablet 20 mg  20 mg Oral Daily Clapacs, Madie Reno, MD   20 mg at 08/07/18 0752  . diphenhydrAMINE (BENADRYL) capsule 50 mg  50 mg Oral Q6H PRN Lavella Hammock, MD      . risperiDONE (RISPERDAL M-TABS) disintegrating tablet 2 mg  2 mg Oral Q8H PRN Lavella Hammock, MD       And  . LORazepam (ATIVAN) tablet 1 mg  1 mg Oral PRN Lavella Hammock, MD       And  . ziprasidone (GEODON) injection 20 mg  20 mg Intramuscular PRN Lavella Hammock, MD      . magnesium hydroxide (MILK OF MAGNESIA) suspension 30 mL  30 mL Oral Daily PRN Lavella Hammock, MD      . nicotine (NICODERM CQ - dosed in mg/24 hours) patch 14 mg  14 mg Transdermal Daily Clapacs, Madie Reno, MD   14 mg at 08/07/18 0623    Lab Results:  Results for orders placed or performed during the  hospital encounter of 08/05/18 (from the past 48 hour(s))  TSH     Status: Abnormal   Collection Time: 08/06/18  4:56 PM  Result Value Ref Range   TSH 6.724 (H) 0.350 - 4.500 uIU/mL    Comment: Performed by a 3rd Generation assay with a functional sensitivity of <=0.01 uIU/mL. Performed at [facility], [address] [place], [place] [address]     Blood Alcohol level:  Lab Results  Component Value Date   ETH 15 (H) 15/17/6160    Metabolic Disorder Labs: No results found for: HGBA1C, MPG No results found for: PROLACTIN No results found for: CHOL, TRIG, HDL, CHOLHDL, VLDL, LDLCALC  Physical Findings: AIMS:  , ,  ,  ,    CIWA:    COWS:     Musculoskeletal: Strength & Muscle Tone: within normal limits Gait & Station: normal Patient leans: N/A  Psychiatric Specialty Exam: Physical Exam  ROS  Blood pressure 132/74, pulse 68, temperature 97.6 F (36.4 C), temperature source Oral, resp. rate 18, height 5\' 9"  (1.753 m), weight 85.5 kg, SpO2 100 %.Body mass index is 27.84 kg/m.  General Appearance: Casual  Eye Contact:  Good  Speech:  Clear and Coherent  Volume:  Normal  Mood:  Dysphoric  Affect:  Appropriate  Thought Process:  Coherent and Descriptions of Associations: Intact  Orientation:  Full (Time, Place, and Person)  Thought Content:  Logical and Rumination  Suicidal Thoughts:  No  Homicidal Thoughts:  No  Memory:  3/3 2/3  Judgement:  Good  Insight:  Good  Psychomotor Activity:  Normal  Concentration:  Concentration: Good  Recall:  Good  Fund of Knowledge:  Good  Language:  Good  Akathisia:  Negative  Handed:  Right  AIMS (if indicated):     Assets:  Leisure Time Physical Health  ADL's:  Intact  Cognition:  WNL  Sleep:  Number of Hours: 7.75     Treatment Plan Summary: Daily contact with patient to assess and evaluate symptoms and progress in treatment, Medication management and Plan Continue citalopram continue cognitive therapy continue  to monitor for safety and withdrawal no thoughts of harming self no change in precautions necessary probable discharge by Monday according to records  [facility], MD 08/07/2018, 8:30 AM

## 2018-08-07 NOTE — Progress Notes (Signed)
D: Patient stated slept good last night .Stated appetite is good and energy level  Is normal. Stated concentration is good . Stated on Depression scale 3 , hopeless 3 and anxiety  5 .( low 0-10 high) Denies suicidal  homicidal ideations  . Denies  auditory hallucinations  No pain concerns . Appropriate ADL'S. Interacting with peers and staff. Patient  instructed on [facility] Education and unit programing , able  to verbalize understanding . Patient working on coping , decision making and  anxiety  issues.  Patient able to maintained  good eye contact.   Compliant with medication  emotional and mental status improving. No  Safety concerns  Goal today "stay open minded" A: Encourage patient participation with unit programming . Instruction  Given on  Medication , verbalize understanding.  R: Voice no other concerns. Staff continue to monitor

## 2018-08-08 MED ORDER — LEVOTHYROXINE SODIUM 50 MCG PO TABS
50.0000 ug | ORAL_TABLET | Freq: Every day | ORAL | Status: DC
  Administered 2018-08-08 – 2018-08-09 (×2): 50 ug via ORAL
  Filled 2018-08-08 (×2): qty 1

## 2018-08-08 NOTE — Progress Notes (Signed)
[facility]  08/08/2018 8:35 AM Pricilla HolmDustin L Wade  MRN:  [id number] Subjective:   Patient remains cordial and cooperative generally stable in mood and affect denies thoughts of harming self and understands what it is to contract here and if he leaves and can do so.  No psychosis no mania, no withdrawal symptoms again patient does seem much improved in mood and affect requesting discharge by tomorrow Patient is reassured he is pleased to learn that his CT scan of the head was normal. His TSH was high. we are going to resume his Synthroid at the outpatient dosing Principal Problem: MDD (major depressive disorder), severe (HCC) Diagnosis: Principal Problem:   MDD (major depressive disorder), severe (HCC) Active Problems:   Suicidal behavior   Alcohol abuse   Thyroid disease   New onset of headaches  Total Time spent with patient: 20 minutes  Past Psychiatric History: As per HPI  Past Medical History:  Past Medical History:  Diagnosis Date  . Thyroid disease     Past Surgical History:  Procedure Laterality Date  . TONSILLECTOMY     Family History: History reviewed. No pertinent family history. Family Psychiatric  History: no new data Social History:  Social History   Substance and Sexual Activity  Alcohol Use Yes     Social History   Substance and Sexual Activity  Drug Use Not Currently  . Types: Marijuana    Social History   Socioeconomic History  . Marital status: Single    Spouse name: Not on file  . Number of children: Not on file  . Years of education: Not on file  . Highest education level: Not on file  Occupational History  . Not on file  Social Needs  . Financial resource strain: Not on file  . Food insecurity:    Worry: Not on file    Inability: Not on file  . Transportation needs:    Medical: Not on file    Non-medical: Not on file  Tobacco Use  . Smoking status: Current Every Day Smoker    Packs/day: 0.50    Types: Cigarettes  . Smokeless  tobacco: Never Used  Substance and Sexual Activity  . Alcohol use: Yes  . Drug use: Not Currently    Types: Marijuana  . Sexual activity: Not on file  Lifestyle  . Physical activity:    Days per week: Not on file    Minutes per session: Not on file  . Stress: Not on file  Relationships  . Social connections:    Talks on phone: Not on file    Gets together: Not on file    Attends religious service: Not on file    Active member of club or organization: Not on file    Attends meetings of clubs or organizations: Not on file    Relationship status: Not on file  Other Topics Concern  . Not on file  Social History Narrative  . Not on file   Additional Social History:                         Sleep: Good  Appetite:  Good  Current Medications: Current Facility-Administered Medications  Medication Dose Route Frequency Provider Last Rate Last Dose  . acetaminophen (TYLENOL) tablet 650 mg  650 mg Oral Q6H PRN Mariel CraftMaurer, Sheila M, MD      . alum & mag hydroxide-simeth (MAALOX/MYLANTA) 200-200-20 MG/5ML suspension 30 mL  30 mL Oral Q4H  PRN Lavella Hammock, MD      . citalopram (CELEXA) tablet 20 mg  20 mg Oral Daily Clapacs, Madie Reno, MD   20 mg at 08/07/18 1062  . diphenhydrAMINE (BENADRYL) capsule 50 mg  50 mg Oral Q6H PRN Lavella Hammock, MD      . risperiDONE (RISPERDAL M-TABS) disintegrating tablet 2 mg  2 mg Oral Q8H PRN Lavella Hammock, MD       And  . LORazepam (ATIVAN) tablet 1 mg  1 mg Oral PRN Lavella Hammock, MD       And  . ziprasidone (GEODON) injection 20 mg  20 mg Intramuscular PRN Lavella Hammock, MD      . magnesium hydroxide (MILK OF MAGNESIA) suspension 30 mL  30 mL Oral Daily PRN Lavella Hammock, MD      . nicotine (NICODERM CQ - dosed in mg/24 hours) patch 14 mg  14 mg Transdermal Daily Clapacs, Madie Reno, MD   14 mg at 08/07/18 6948    Lab Results:  Results for orders placed or performed during the hospital encounter of 08/05/18 (from the past 48  hour(s))  TSH     Status: Abnormal   Collection Time: 08/06/18  4:56 PM  Result Value Ref Range   TSH 6.724 (H) 0.350 - 4.500 uIU/mL    Comment: Performed by a 3rd Generation assay with a functional sensitivity of <=0.01 uIU/mL. Performed at [facility], [address] [place], [place] [address]     Blood Alcohol level:  Lab Results  Component Value Date   ETH 92 (H) 03/50/0938    Metabolic Disorder Labs: No results found for: HGBA1C, MPG No results found for: PROLACTIN No results found for: CHOL, TRIG, HDL, CHOLHDL, VLDL, LDLCALC  Physical Findings: AIMS:  , ,  ,  ,    CIWA:    COWS:     Musculoskeletal: Strength & Muscle Tone: within normal limits Gait & Station: normal Patient leans: N/A  Psychiatric Specialty Exam: Physical Exam  ROS  Blood pressure 122/84, pulse 68, temperature 97.9 F (36.6 C), temperature source Oral, resp. rate 18, height 5\' 9"  (1.753 m), weight 85.5 kg, SpO2 100 %.Body mass index is 27.84 kg/m.  General Appearance: Casual  Eye Contact:  Good  Speech:  Clear and Coherent  Volume:  Normal  Mood:  Euthymic  Affect:  Appropriate  Thought Process:  Coherent  Orientation:  Full (Time, Place, and Person)  Thought Content:  Logical  Suicidal Thoughts:  No  Homicidal Thoughts:  No  Memory:  Immediate;   Good  Judgement:  Good  Insight:  Good  Psychomotor Activity:  Normal  Concentration:  Concentration: Good  Recall:  Good  Fund of Knowledge:  Good  Language:  Good  Akathisia:  Negative  Handed:  Right  AIMS (if indicated):     Assets:  Communication Skills Desire for Improvement  ADL's:  Intact  Cognition:  WNL  Sleep:  Number of Hours: 7     Treatment Plan Summary: Daily contact with patient to assess and evaluate symptoms and progress in treatment, Medication management and Plan As above continue antidepressant therapy, resume Synthroid at outpatient dosing, patient reassured of negative CT scan, probable discharge  tomorrow no change in precautions or medications otherwise  Johnn Hai, MD 08/08/2018, 8:35 AM

## 2018-08-08 NOTE — Plan of Care (Signed)
D- Patient alert and oriented. Patient presents in a pleasant mood on assessment stating that he slept "great" last night and had no major complaints to voice to this Probation officer. Patient reports that his depression/anxiety is "very low, I feel great, I'm ready to go home". Patient also denies SI, HI, AVH, and pain at this time. Patient's goal for today is to "stay focused", in which he will "stay open" in order to accomplish his goal.  A- Scheduled medications administered to patient, per MD orders. Support and encouragement provided.  Routine safety checks conducted every 15 minutes.  Patient informed to notify staff with problems or concerns.  R- No adverse drug reactions noted. Patient contracts for safety at this time. Patient compliant with medications and treatment plan. Patient receptive, calm, and cooperative. Patient interacts well with others on the unit.  Patient remains safe at this time.  Problem: Education: Goal: Emotional status will improve Outcome: Progressing Goal: Mental status will improve Outcome: Progressing   Problem: Health Behavior/Discharge Planning: Goal: Compliance with treatment plan for underlying cause of condition will improve Outcome: Progressing   Problem: Safety: Goal: Periods of time without injury will increase Outcome: Progressing   Problem: Coping: Goal: Coping ability will improve Outcome: Progressing   Problem: Safety: Goal: Ability to disclose and discuss suicidal ideas will improve Outcome: Progressing

## 2018-08-08 NOTE — Plan of Care (Signed)
  Problem: Education: Goal: Emotional status will improve Outcome: Progressing Goal: Mental status will improve Outcome: Progressing   D: Patient has been socializing with peers in the dayroom. Interacting appropriately with staff and peers. Denies SI, HI and AV hallucinations. Contracting for safety. Mood is pleasant. Affect is appropriate to circumstance. Patient states he is ready for discharge. A: Continue to monitor for safety. R: Safety maintained.

## 2018-08-08 NOTE — Plan of Care (Signed)
Patient is well adjusted , and appropriately complying with safety in the unit, attends and participate in group activities with peers, socially active , and takes his medications regularly . Denies any SI/HI/AVH. Denies any physical concerns . Patient is eating well and well hydrated sleep is continues and only requiring 15 minutes safety rounding and no distress.   Problem: Education: Goal: Emotional status will improve Outcome: Progressing Goal: Mental status will improve Outcome: Progressing   Problem: Health Behavior/Discharge Planning: Goal: Compliance with treatment plan for underlying cause of condition will improve Outcome: Progressing   Problem: Safety: Goal: Periods of time without injury will increase Outcome: Progressing   Problem: Coping: Goal: Coping ability will improve Outcome: Progressing   Problem: Safety: Goal: Ability to disclose and discuss suicidal ideas will improve Outcome: Progressing

## 2018-08-08 NOTE — [facility] Group Notes (Signed)
LCSW Group Therapy Note 08/08/2018 1:15pm  Type of Therapy and Topic: Group Therapy: Feelings Around Returning Home & Establishing a Supportive Framework and Supporting Oneself When Supports Not Available  Participation Level: Active  Description of Group:  Patients first processed thoughts and feelings about upcoming discharge. These included fears of upcoming changes, lack of change, new living environments, judgements and expectations from others and overall stigma of mental health issues. The group then discussed the definition of a supportive framework, what that looks and feels like, and how do to discern it from an unhealthy non-supportive network. The group identified different types of supports as well as what to do when your family/friends are less than helpful or unavailable  Therapeutic Goals  1. Patient will identify one healthy supportive network that they can use at discharge. 2. Patient will identify one factor of a supportive framework and how to tell it from an unhealthy network. 3. Patient able to identify one coping skill to use when they do not have positive supports from others. 4. Patient will demonstrate ability to communicate their needs through discussion and/or role plays.  Summary of Patient Progress:  The patient reported that he feels "good." Pt engaged during group session. As patients processed their anxiety about discharge and described healthy supports patient shared he is ready to be discharge. He stated, "I feel more stable." He listed his sister as his main support. Patients identified at least one self-care tool they were willing to use after discharge; exercise.   Therapeutic Modalities Cognitive Behavioral Therapy Motivational Interviewing   Geniece Akers  CUEBAS-COLON, LCSW 08/08/2018 11:52 AM

## 2018-08-08 NOTE — Progress Notes (Signed)
D: Patient has been socializing with peers in the dayroom. Interacting appropriately with staff and peers. Denies SI, HI and AV hallucinations. Contracting for safety. Mood is pleasant. Affect is appropriate to circumstance. Patient states he is ready for discharge. A: Continue to monitor for safety. R: Safety maintained.

## 2018-08-09 MED ORDER — LEVOTHYROXINE SODIUM 50 MCG PO TABS: 50.0000 ug | ORAL_TABLET | Freq: Every day | ORAL | 1 refills | Status: AC

## 2018-08-09 MED ORDER — CITALOPRAM HYDROBROMIDE 20 MG PO TABS: 20.0000 mg | ORAL_TABLET | Freq: Every day | ORAL | 1 refills | Status: AC

## 2018-08-09 NOTE — Discharge Summary (Signed)
Physician Discharge Summary Note  Patient:  Louis Wade is an 33 y.o., male MRN:  [id number] DOB:  Aug 04, 1985 Patient phone:  [phone] (home)  Patient address:   [address] [place] [address],  Total Time spent with patient: 45 minutes  Date of Admission:  08/05/2018 Date of Discharge: August 09, 2018  Reason for Admission: Admitted through the emergency room where he presented after making suicidal statements and presented very intoxicated  Principal Problem: MDD (major depressive disorder), severe ([place]) Discharge Diagnoses: Principal Problem:   MDD (major depressive disorder), severe ([place]) Active Problems:   Suicidal behavior   Alcohol abuse   Thyroid disease   New onset of headaches   Past Psychiatric History: Past history of alcohol abuse thyroid disease depression.  Past Medical History:  Past Medical History:  Diagnosis Date  . Thyroid disease     Past Surgical History:  Procedure Laterality Date  . TONSILLECTOMY     Family History: History reviewed. No pertinent family history. Family Psychiatric  History: Family history negative Social History:  Social History   Substance and Sexual Activity  Alcohol Use Yes     Social History   Substance and Sexual Activity  Drug Use Not Currently  . Types: Marijuana    Social History   Socioeconomic History  . Marital status: Single    Spouse name: Not on file  . Number of children: Not on file  . Years of education: Not on file  . Highest education level: Not on file  Occupational History  . Not on file  Social Needs  . Financial resource strain: Not on file  . Food insecurity:    Worry: Not on file    Inability: Not on file  . Transportation needs:    Medical: Not on file    Non-medical: Not on file  Tobacco Use  . Smoking status: Current Every Day Smoker    Packs/day: 0.50    Types: Cigarettes  . Smokeless tobacco: Never Used  Substance and Sexual Activity  . Alcohol use: Yes  . Drug  use: Not Currently    Types: Marijuana  . Sexual activity: Not on file  Lifestyle  . Physical activity:    Days per week: Not on file    Minutes per session: Not on file  . Stress: Not on file  Relationships  . Social connections:    Talks on phone: Not on file    Gets together: Not on file    Attends religious service: Not on file    Active member of club or organization: Not on file    Attends meetings of clubs or organizations: Not on file    Relationship status: Not on file  Other Topics Concern  . Not on file  Social History Narrative  . Not on file    Hospital Course:   detoxed without difficulty.  No complications no seizures no delirium.  Engaged appropriately in therapy individual and group.  Started on citalopram tolerated medicine without difficulty.  Showed improved insight.  Showed no dangerous behavior in the hospital.  Agreed to appropriate outpatient treatment at discharge.  No longer met commitment criteria.  Calm and denied any suicidal thought. Physical Findings: AIMS:  , ,  ,  ,    CIWA:    COWS:     Musculoskeletal: Strength & Muscle Tone: within normal limits Gait & Station: normal Patient leans: N/A  Psychiatric Specialty Exam: Physical Exam  Nursing note and vitals reviewed. Constitutional:  He appears well-developed and well-nourished.  HENT:  Head: Normocephalic and atraumatic.  Eyes: Pupils are equal, round, and reactive to light. Conjunctivae are normal.  Neck: Normal range of motion.  Cardiovascular: Regular rhythm and normal heart sounds.  Respiratory: Effort normal. He has no wheezes.  GI: Soft.  Musculoskeletal: Normal range of motion.  Neurological: He is alert.  Skin: Skin is warm and dry.  Psychiatric: He has a normal mood and affect. His behavior is normal. Judgment and thought content normal.    Review of Systems  Constitutional: Negative.   HENT: Negative.   Eyes: Negative.   Respiratory: Negative.   Cardiovascular: Negative.    Gastrointestinal: Negative.   Musculoskeletal: Negative.   Skin: Negative.   Neurological: Negative.   Psychiatric/Behavioral: Negative.     Blood pressure 128/86, pulse 68, temperature 97.9 F (36.6 C), temperature source Oral, resp. rate 18, height '5\' 9"'$  (1.753 m), weight 85.5 kg, SpO2 100 %.Body mass index is 27.84 kg/m.  General Appearance: Casual  Eye Contact:  Good  Speech:  Clear and Coherent  Volume:  Normal  Mood:  Euthymic  Affect:  Constricted  Thought Process:  Goal Directed  Orientation:  Full (Time, Place, and Person)  Thought Content:  Logical  Suicidal Thoughts:  No  Homicidal Thoughts:  No  Memory:  Immediate;   Fair Recent;   Fair Remote;   Fair  Judgement:  Fair  Insight:  Fair  Psychomotor Activity:  Increased  Concentration:  Concentration: Fair  Recall:  [employer] of Knowledge:  Fair  Language:  Fair  Akathisia:  No  Handed:  Right  AIMS (if indicated):     Assets:  Desire for Improvement  ADL's:  Intact  Cognition:  WNL  Sleep:  Number of Hours: 8     Have you used any form of tobacco in the last 30 days? (Cigarettes, Smokeless Tobacco, Cigars, and/or Pipes): Yes  Has this patient used any form of tobacco in the last 30 days? (Cigarettes, Smokeless Tobacco, Cigars, and/or Pipes) Yes, Yes, A prescription for an FDA-approved tobacco cessation medication was offered at discharge and the patient refused  Blood Alcohol level:  Lab Results  Component Value Date   ETH 75 (H) 10/14/4816    Metabolic Disorder Labs:  No results found for: HGBA1C, MPG No results found for: PROLACTIN No results found for: CHOL, TRIG, HDL, CHOLHDL, VLDL, LDLCALC  See Psychiatric Specialty Exam and Suicide Risk Assessment completed by Attending Physician prior to discharge.  Discharge destination:  Home  Is patient on multiple antipsychotic therapies at discharge:  No   Has Patient had three or more failed trials of antipsychotic monotherapy by history:   No  Recommended Plan for Multiple Antipsychotic Therapies: NA  Discharge Instructions    Diet - low sodium heart healthy   Complete by:  As directed    Increase activity slowly   Complete by:  As directed      Allergies as of 08/09/2018   No Known Allergies     Medication List    STOP taking these medications   butalbital-acetaminophen-caffeine 50-325-40 MG tablet Commonly known as:  FIORICET   magic mouthwash w/lidocaine Soln   predniSONE 10 MG tablet Commonly known as:  DELTASONE   SUMAtriptan 100 MG tablet Commonly known as:  IMITREX     TAKE these medications     Indication  citalopram 20 MG tablet Commonly known as:  CELEXA Take 1 tablet (20 mg total) by mouth daily.  Start taking on:  August 10, 2018  Indication:  Depression   levothyroxine 50 MCG tablet Commonly known as:  SYNTHROID Take 1 tablet (50 mcg total) by mouth daily at 6 (six) AM. Start taking on:  August 10, 2018 What changed:  when to take this  Indication:  Underactive Thyroid      Follow-up Information    [facility] Follow up.   Why:  Please follow up.  Walk in hours are from 9-4 Monday through Friday. Contact information: [address]  [place] [address] [phone]           Follow-up recommendations:  Activity:  Activity as tolerated Diet:  Regular diet Other:  Follow-up with [facility]  Comments: Psychoeducation and counseling completed.  Prescriptions given.  Patient agrees to trial of medication.  Signed: Alethia Berthold, MD 08/09/2018, 6:18 PM

## 2018-08-09 NOTE — Progress Notes (Addendum)
Recreation Therapy Notes   Date: 08/09/2018  Time: 9:30 am  Location: Craft room  Behavioral response: Appropriate  Intervention Topic: Stress  Discussion/Intervention:  Group content on today was focused on stress. The group defined stress and way to cope with stress. Participants expressed how they know when they are stresses out. Individuals described the different ways they have to cope with stress. The group stated reasons why it is important to cope with stress. Patient explained what good stress is and some examples. The group participated in the intervention "Exploring stress". Individuals were separated into two group and answered questions related to stress.  Clinical Observations/Feedback:  Patient came to group and defined stress as exhaustion. He identified negative coping skills he normally uses are smoking and drinking. Participant explained some positive coping skills he can use in place of the negative ones are spending time with family and sleep. Patient expressed when he is stressed, he is normally moody. Individual was social with peers and staff while participating in the intervention. [name] LRT/CTRS         Louis Wade 08/09/2018 12:06 PM

## 2018-08-09 NOTE — Plan of Care (Signed)
  Problem: Education: Goal: Emotional status will improve Outcome: Progressing Goal: Mental status will improve Outcome: Progressing   D: Patient has been socializing in the day room with his peers. Mood is pleasant. Affect appropriate to circumstance. Denies SI, HI and AV hallucinations. A: Continue to monitor for safety. R: Safety maintained.

## 2018-08-09 NOTE — Progress Notes (Signed)
Patient ID: Louis Wade, male   DOB: 1985-12-07, 33 y.o.   MRN: [id number]   Discharge Note:  Patient denies SI/HI/AVH at this time. Discharge instructions, AVS, prescriptions, and transition record gone over with patient. Patient agrees to comply with medication management, follow-up visit, and outpatient therapy. Patient belongings returned to patient. Patient questions and concerns addressed and answered. Patient ambulatory off unit. Patient discharged to home with his sister.

## 2018-08-09 NOTE — Progress Notes (Signed)
  [facility] Adult Case Management Discharge Plan :  Will you be returning to the same living situation after discharge:  Yes,  pt reports that he is returning home.  At discharge, do you have transportation home?: Yes,  pt reports sister will provide transportation home.  Do you have the ability to pay for your medications: Yes,  [employer]  Release of information consent forms completed and in the chart;  Patient's signature needed at discharge.  Patient to Follow up at: Follow-up Information    [facility] Follow up.   Why:  Please follow up.  Walk in hours are from 9-4 Monday through Friday. Contact information: [place] [address] [phone]           Next level of care provider has access to [place] and Suicide Prevention discussed: No. Patient could not recall any numbers for family or peers.  SPE completed with the patient.   Have you used any form of tobacco in the last 30 days? (Cigarettes, Smokeless Tobacco, Cigars, and/or Pipes): Yes  Has patient been referred to the Quitline?: Patient refused referral  Patient has been referred for addiction treatment: Pt. refused referral  Rozann Lesches, LCSW 08/09/2018, 9:00 AM

## 2018-08-09 NOTE — Progress Notes (Signed)
D: Patient has been socializing in the day room with his peers. Mood is pleasant. Affect appropriate to circumstance. Denies SI, HI and AV hallucinations. A: Continue to monitor for safety. R: Safety maintained.

## 2018-08-09 NOTE — Progress Notes (Signed)
Recreation Therapy Notes  INPATIENT RECREATION TR PLAN  Patient Details Name: Louis Wade MRN: [id number] DOB: 1985-03-07 Today's Date: 08/09/2018  Rec Therapy Plan Is patient appropriate for Therapeutic Recreation?: Yes Treatment times per week: at least 3 Estimated Length of Stay: 5-7 days TR Treatment/Interventions: Group participation (Comment)  Discharge Criteria Pt will be discharged from therapy if:: Discharged Treatment plan/goals/alternatives discussed and agreed upon by:: Patient/family  Discharge Summary Short term goals set: Patient will identify 3 positive coping skills strategies to use post d/c within 5 recreation therapy group sessions Short term goals met: Adequate for discharge Progress toward goals comments: Groups attended Which groups?: Stress management, Other (Comment)(Relaxation) Reason goals not met: N/A Therapeutic equipment acquired: N/A Reason patient discharged from therapy: Discharge from hospital Pt/family agrees with progress & goals achieved: Yes Date patient discharged from therapy: 08/09/18   Allessandra Bernardi 08/09/2018, 12:27 PM

## 2018-08-09 NOTE — BHH Suicide Risk Assessment (Signed)
[facility] Discharge Suicide Risk Assessment   Principal Problem: MDD (major depressive disorder), severe ([place]) Discharge Diagnoses: Principal Problem:   MDD (major depressive disorder), severe ([place]) Active Problems:   Suicidal behavior   Alcohol abuse   Thyroid disease   New onset of headaches   Total Time spent with patient: 45 minutes  Musculoskeletal: Strength & Muscle Tone: within normal limits Gait & Station: normal Patient leans: N/A  Psychiatric Specialty Exam: Review of Systems  Constitutional: Negative.   HENT: Negative.   Eyes: Negative.   Respiratory: Negative.   Cardiovascular: Negative.   Gastrointestinal: Negative.   Musculoskeletal: Negative.   Skin: Negative.   Neurological: Negative.   Psychiatric/Behavioral: Negative.     Blood pressure 128/86, pulse 68, temperature 97.9 F (36.6 C), temperature source Oral, resp. rate 18, height 5\' 9"  (1.753 m), weight 85.5 kg, SpO2 100 %.Body mass index is 27.84 kg/m.  General Appearance: Casual  Eye Contact::  Good  Speech:  Clear and Coherent409  Volume:  Normal  Mood:  Euthymic  Affect:  Appropriate  Thought Process:  Coherent  Orientation:  Full (Time, Place, and Person)  Thought Content:  Logical  Suicidal Thoughts:  No  Homicidal Thoughts:  No  Memory:  Immediate;   Fair Recent;   Fair Remote;   Fair  Judgement:  Fair  Insight:  Fair  Psychomotor Activity:  Normal  Concentration:  Fair  Recall:  [employer] of Knowledge:Fair  Language: Fair  Akathisia:  No  Handed:  Right  AIMS (if indicated):     Assets:  Communication Skills Desire for Improvement Physical Health Resilience  Sleep:  Number of Hours: 8  Cognition: WNL  ADL's:  Intact   Mental Status Per Nursing Assessment::   On Admission:  NA  Demographic Factors:  Male and Living alone  Loss Factors: Financial problems/change in socioeconomic status  Historical Factors: Impulsivity  Risk Reduction Factors:   Responsible for  children under 53 years of age, Sense of responsibility to family and Positive social support  Continued Clinical Symptoms:  Depression:   Impulsivity  Cognitive Features That Contribute To Risk:  None    Suicide Risk:  Minimal: No identifiable suicidal ideation.  Patients presenting with no risk factors but with morbid ruminations; may be classified as minimal risk based on the severity of the depressive symptoms  Follow-up Information    [facility] Follow up.   Why:  Please follow up.  Walk in hours are from 9-4 Monday through Friday. Contact information: [address] [place] [address] [phone]           Plan Of Care/Follow-up recommendations:  Activity:  Activity as tolerated Diet:  Regular diet Other:  Continue current medication and follow-up with [facility] outpatient.  Avoid substance abuse.  Return if suicidality were to occur or symptoms worsen greatly.  Patient agreeable to plan.  Alethia Berthold, MD 08/09/2018, 9:10 AM

## 2018-08-31 ENCOUNTER — Other Ambulatory Visit: Payer: Self-pay

## 2018-08-31 ENCOUNTER — Telehealth: Payer: Self-pay

## 2018-08-31 DIAGNOSIS — Z20822 Contact with and (suspected) exposure to covid-19: Secondary | ICD-10-CM

## 2018-08-31 NOTE — Telephone Encounter (Signed)
Angie from [facility] called to get pt tested for Covid due to potential exposure. Called pt and scheduled testing. No symptoms. Pt given appt today at [facility]. 3:30. Pt advised to self quarantine and to stay in car and wear mask to testing site. Pt verbalized understanding.

## 2018-08-31 NOTE — Telephone Encounter (Signed)
This encounter was created in error - please disregard.

## 2018-09-07 LAB — NOVEL CORONAVIRUS, NAA: SARS-CoV-2, NAA: NOT DETECTED

## 2018-09-17 ENCOUNTER — Other Ambulatory Visit: Payer: Self-pay

## 2018-09-17 DIAGNOSIS — Z20822 Contact with and (suspected) exposure to covid-19: Secondary | ICD-10-CM

## 2018-09-22 LAB — NOVEL CORONAVIRUS, NAA: SARS-CoV-2, NAA: NOT DETECTED

## 2018-12-21 ENCOUNTER — Other Ambulatory Visit: Payer: Self-pay

## 2018-12-21 DIAGNOSIS — Z20822 Contact with and (suspected) exposure to covid-19: Secondary | ICD-10-CM

## 2018-12-23 LAB — NOVEL CORONAVIRUS, NAA: SARS-CoV-2, NAA: NOT DETECTED

## 2019-02-01 ENCOUNTER — Encounter: Payer: Self-pay | Admitting: Emergency Medicine

## 2019-02-01 ENCOUNTER — Emergency Department
Admission: EM | Admit: 2019-02-01 | Discharge: 2019-02-01 | Disposition: A | Discharge status: Home / Self Care | Payer: 59 | Attending: Student | Admitting: Student

## 2019-02-01 ENCOUNTER — Other Ambulatory Visit: Payer: Self-pay

## 2019-02-01 DIAGNOSIS — Z79899 Other long term (current) drug therapy: Secondary | ICD-10-CM | POA: Insufficient documentation

## 2019-02-01 DIAGNOSIS — Z20828 Contact with and (suspected) exposure to other viral communicable diseases: Secondary | ICD-10-CM

## 2019-02-01 DIAGNOSIS — Z20822 Contact with and (suspected) exposure to covid-19: Secondary | ICD-10-CM

## 2019-02-01 DIAGNOSIS — F17210 Nicotine dependence, cigarettes, uncomplicated: Secondary | ICD-10-CM | POA: Insufficient documentation

## 2019-02-01 DIAGNOSIS — Z7689 Persons encountering health services in other specified circumstances: Secondary | ICD-10-CM | POA: Insufficient documentation

## 2019-02-01 NOTE — ED Provider Notes (Signed)
[facility] Emergency Department Provider Note  ____________________________________________  Time seen: Approximately 4:58 PM  I have reviewed the triage vital signs and the nursing notes.   HISTORY  Chief Complaint work note    HPI Louis Wade is a 33 y.o. male who presents the emergency department requesting a work note he had a Covid test.  Patient states that he was informed that he was exposed to somebody that had Covid several days prior.  Patient denies any symptoms currently.  He states that his work is making him calm, received a note to return to work.  Patient states that his job is socially distant as is.  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  Patient states that he works alone and even if he was positive he could socially distance.  Patient denies any symptoms at this time.  No other complaints.         Past Medical History:  Diagnosis Date  . Thyroid disease     Patient Active Problem List   Diagnosis Date Noted  . New onset of headaches 08/06/2018  . Suicidal behavior 08/05/2018  . Overdose 08/05/2018  . MDD (major depressive disorder), severe ([place]) 08/05/2018  . Alcohol abuse 08/05/2018  . Thyroid disease 08/05/2018    Past Surgical History:  Procedure Laterality Date  . TONSILLECTOMY      Prior to Admission medications   Medication Sig Start Date End Date Taking? Authorizing Provider  citalopram (CELEXA) 20 MG tablet Take 1 tablet (20 mg total) by mouth daily. 08/10/18   Clapacs, Madie Reno, MD  levothyroxine (SYNTHROID) 50 MCG tablet Take 1 tablet (50 mcg total) by mouth daily at 6 (six) AM. 08/10/18   Clapacs, Madie Reno, MD    Allergies Patient has no known allergies.  No family history on file.  Social History Social History   Tobacco Use  . Smoking status: Current Every Day Smoker    Packs/day: 0.50    Types: Cigarettes  . Smokeless tobacco: Never Used  Substance Use Topics  . Alcohol  use: Yes  . Drug use: Not Currently    Types: Marijuana     Review of Systems  Constitutional: No fever/chills Eyes: No visual changes. No discharge ENT: No upper respiratory complaints. Cardiovascular: no chest pain. Respiratory: no cough. No SOB. Gastrointestinal: No abdominal pain.  No nausea, no vomiting.  No diarrhea.  No constipation. Musculoskeletal: Negative for musculoskeletal pain. Skin: Negative for rash, abrasions, lacerations, ecchymosis. Neurological: Negative for headaches, focal weakness or numbness. 10-point ROS otherwise negative.  ____________________________________________   PHYSICAL EXAM:  VITAL SIGNS: ED Triage Vitals  Enc Vitals Group     BP 02/01/19 1549 (!) 161/90     Pulse Rate 02/01/19 1549 (!) 103     Resp 02/01/19 1549 18     Temp 02/01/19 1549 98.9 F (37.2 C)     Temp Source 02/01/19 1549 Oral     SpO2 02/01/19 1549 100 %     Weight 02/01/19 1543 188 lb 7.9 oz (85.5 kg)     Height --      Head Circumference --      Peak Flow --      Pain Score 02/01/19 1543 0     Pain Loc --      Pain Edu? --      Excl. in [place]? --      Constitutional: Alert and oriented. Well appearing and in no acute distress.  Eyes: Conjunctivae are normal. PERRL. EOMI. Head: Atraumatic. ENT:      Ears:       Nose: No congestion/rhinnorhea.      Mouth/Throat: Mucous membranes are moist.  Neck: No stridor.    Cardiovascular: Normal rate, regular rhythm. Normal S1 and S2.  Good peripheral circulation. Respiratory: Normal respiratory effort without tachypnea or retractions. Lungs CTAB. Good air entry to the bases with no decreased or absent breath sounds. Musculoskeletal: Full range of motion to all extremities. No gross deformities appreciated. Neurologic:  Normal speech and language. No gross focal neurologic deficits are appreciated.  Skin:  Skin is warm, dry and intact. No rash noted. Psychiatric: Mood and affect are normal. Speech and behavior are normal.  Patient exhibits appropriate insight and judgement.   ____________________________________________   LABS (all labs ordered are listed, but only abnormal results are displayed)  Labs Reviewed  NOVEL CORONAVIRUS, NAA (HOSP ORDER, SEND-OUT TO REF LAB; TAT 18-24 HRS)   ____________________________________________  EKG   ____________________________________________  RADIOLOGY   No results found.  ____________________________________________    PROCEDURES  Procedure(s) performed:    Procedures    Medications - No data to display   ____________________________________________   INITIAL IMPRESSION / ASSESSMENT AND PLAN / ED COURSE  Pertinent labs & imaging results that were available during my care of the patient were reviewed by me and considered in my medical decision making (see chart for details).  Review of the [place] CSRS was performed in accordance of the [facility] prior to dispensing any controlled drugs.           Patient's diagnosis is consistent with encounter for COVID-19 testing, encounter for work clearance.  Patient is currently asymptomatic.  No medications at this time.  I have discussed return to work with a negative test, positive test or if patient develops symptoms..  Patient verbalizes understanding of same.  Follow-up primary care as needed.  Patient is given ED precautions to return to the ED for any worsening or new symptoms.     ____________________________________________  FINAL CLINICAL IMPRESSION(S) / ED DIAGNOSES  Final diagnoses:  Encounter for laboratory testing for COVID-19 virus  Return to work evaluation      NEW MEDICATIONS STARTED DURING THIS VISIT:  ED Discharge Orders    None          This chart was dictated using voice recognition software/Dragon. Despite best efforts to proofread, errors can occur which can change the meaning. Any change was purely unintentional.    Racheal Patches, PA-C 02/01/19  1716    Miguel Aschoff., MD 02/02/19 [phone]

## 2019-02-01 NOTE — ED Notes (Signed)
See triage note  States he was made aware that he was around someone that was positive for COVID  Denies any sx's  No fever or cough

## 2019-02-01 NOTE — ED Triage Notes (Signed)
Arrives today to get a work note to return to work.  Patient states he received notification that he was exposed to someone with COVID on Thanksgiving.  Patient denies symptoms.  AAOx3.  Skin warm and dry. NAD

## 2019-02-03 LAB — NOVEL CORONAVIRUS, NAA (HOSP ORDER, SEND-OUT TO REF LAB; TAT 18-24 HRS): SARS-CoV-2, NAA: NOT DETECTED

## 2019-10-19 IMAGING — CT CT HEAD WITHOUT CONTRAST
3 series · 15 of 47 positions shown, 18 images · non-contrast
Comparison: None.

CLINICAL DATA: Acute onset of headache.

EXAM:
CT HEAD WITHOUT CONTRAST
TECHNIQUE: Contiguous axial images were obtained from the base of the skull
through the vertex without intravenous contrast.

[Series 2: head wo · axial · 0.42mm/px · z∈[+419,+544]mm · 9 of 30 slices shown, 12 images]
[im 3/30  brain]
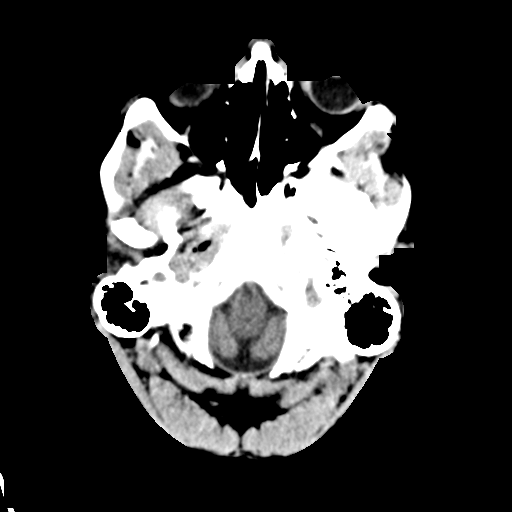
[im 3/30  bone]
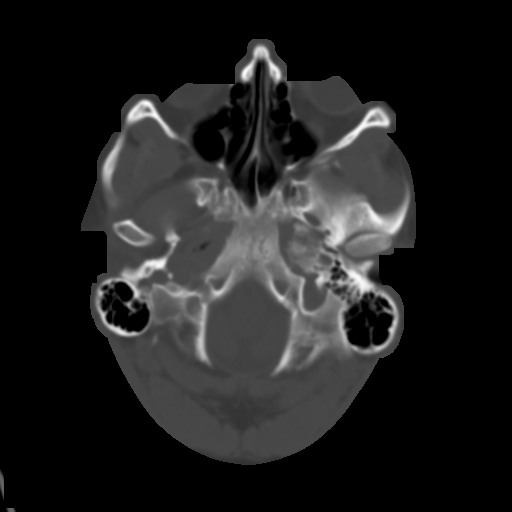
[im 6/30  brain]
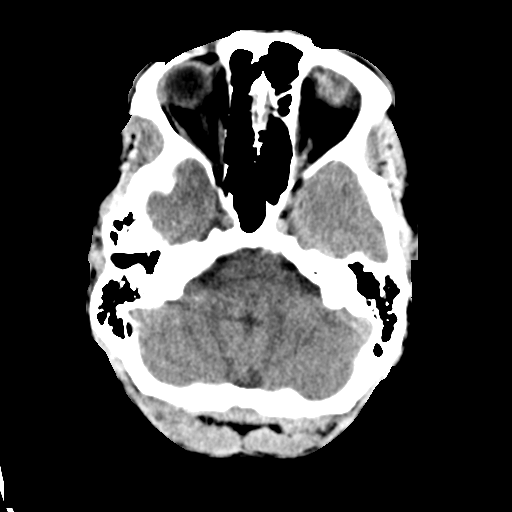
[im 9/30  brain]
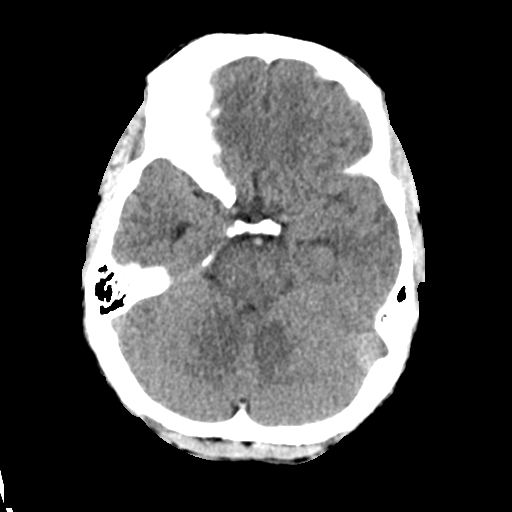
[im 12/30  brain]
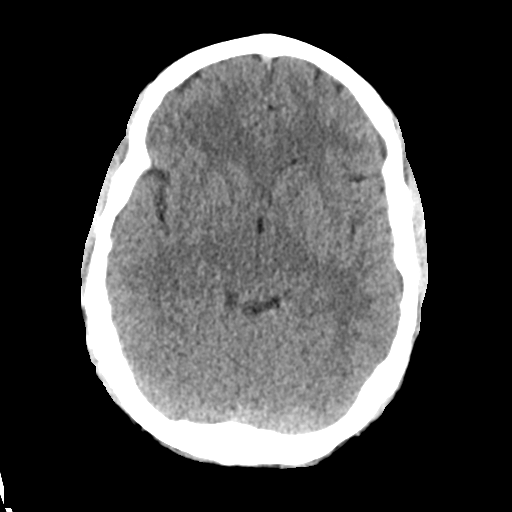
[im 16/30  brain]
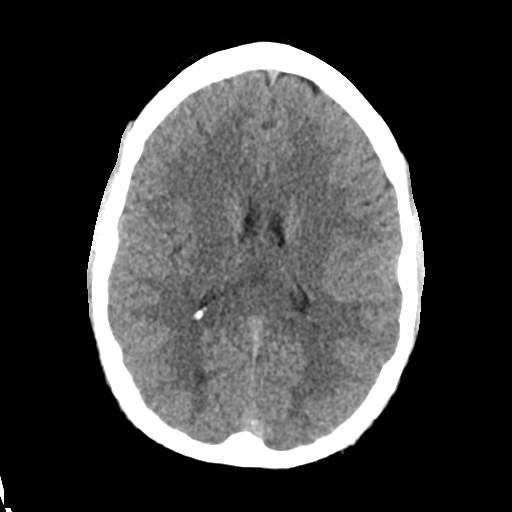
[im 16/30  bone]
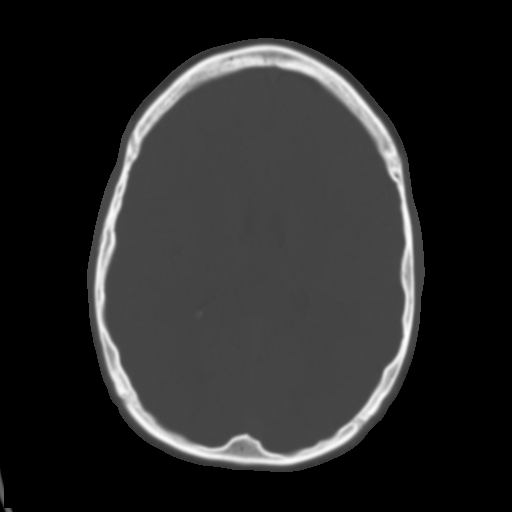
[im 19/30  brain]
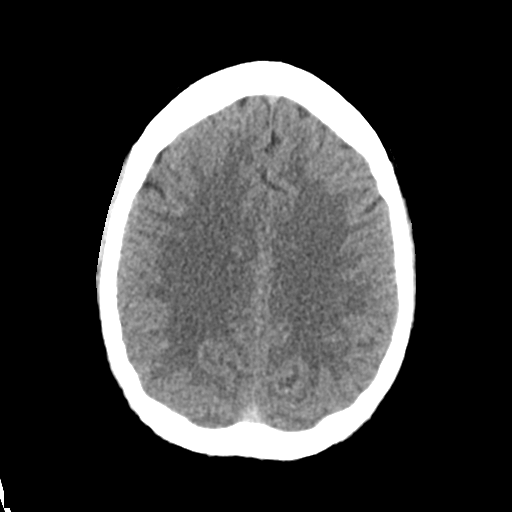
[im 22/30  brain]
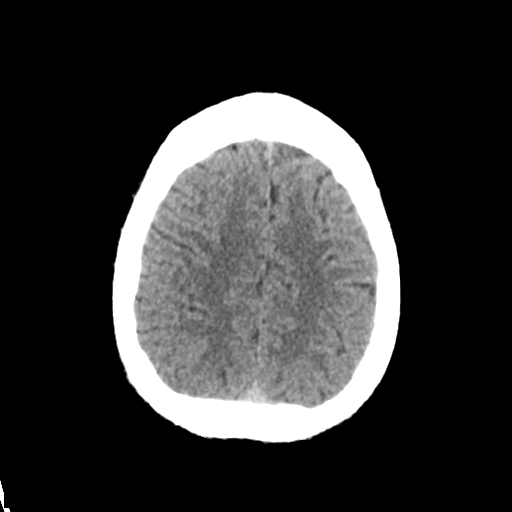
[im 25/30  brain]
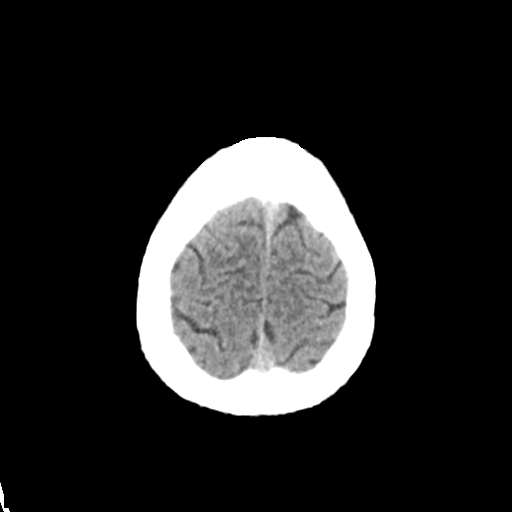
[im 28/30  brain]
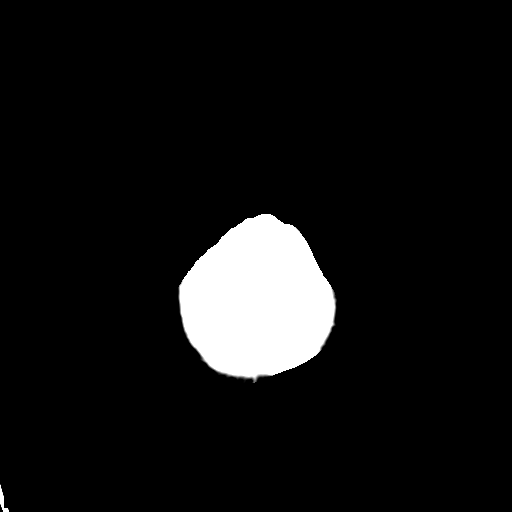
[im 28/30  bone]
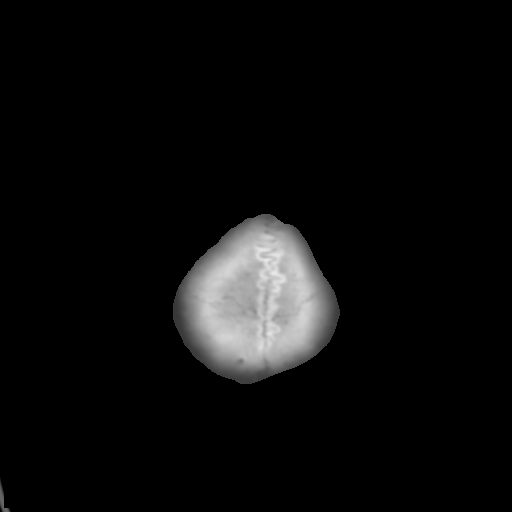

[Series 4: coronal soft tissue · coronal · 0.31mm/px · 3 of 62 slices shown]
[im 21/62  brain]
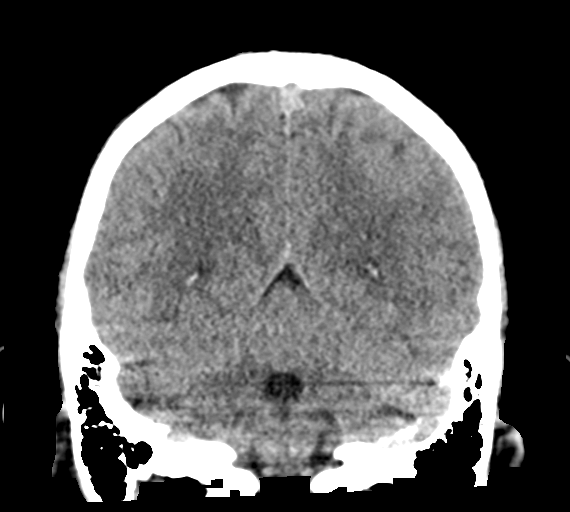
[im 28/62  brain]
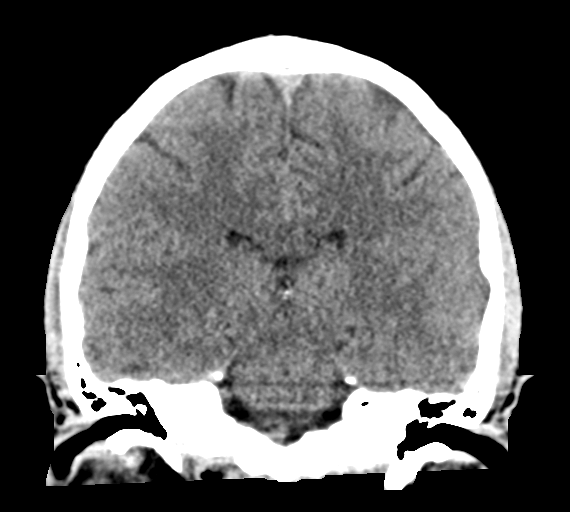
[im 34/62  brain]
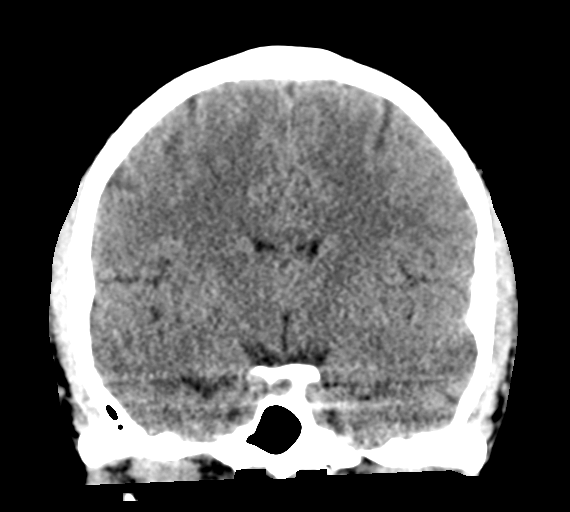

[Series 5: sagittal soft tissue · sagittal · 0.31mm/px · 3 of 50 slices shown]
[im 17/50  brain]
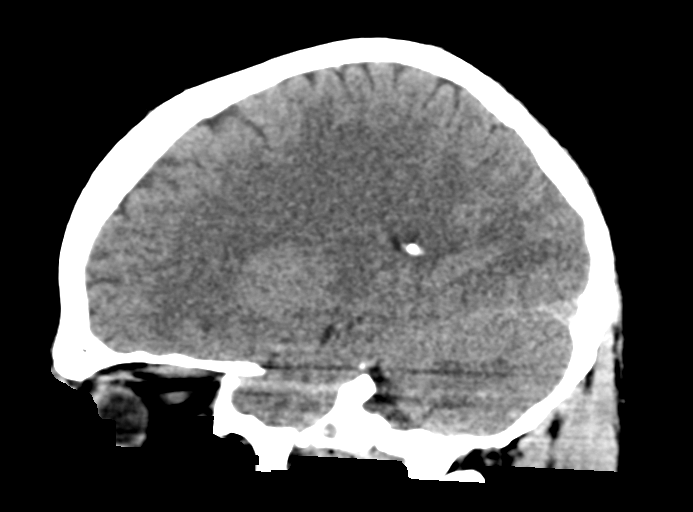
[im 25/50  brain]
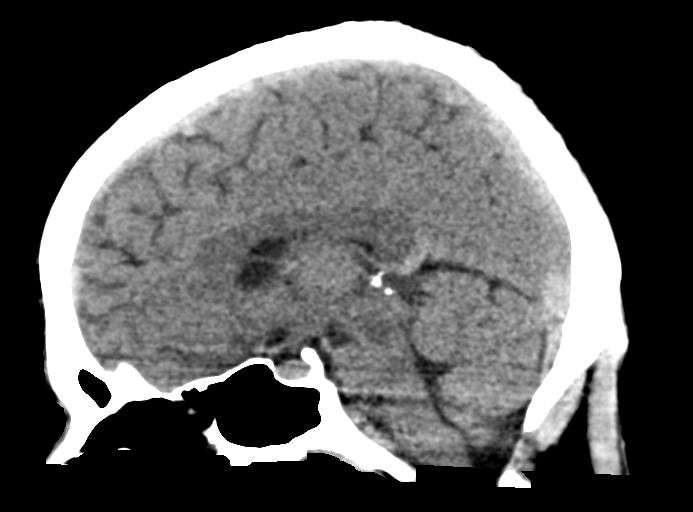
[im 33/50  brain]
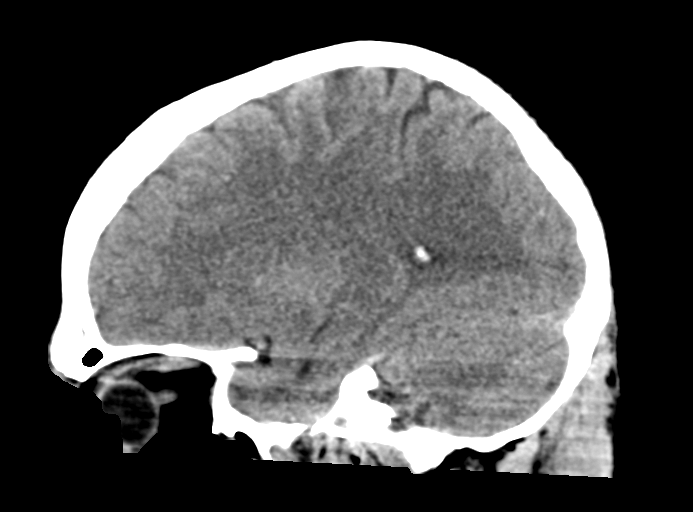

[15 of 47 positions shown; findings below may reference images not displayed]

FINDINGS: Brain: The brain shows a normal appearance without evidence of
malformation, atrophy, old or acute small or large vessel
infarction, mass lesion, hemorrhage, hydrocephalus or extra-axial
collection.

Vascular: No hyperdense vessel. No evidence of atherosclerotic
calcification.

Skull: Normal.  No traumatic finding.  No focal bone lesion.

Sinuses/Orbits: Sinuses are clear. Orbits appear normal. Mastoids
are clear.

Other: None significant
IMPRESSION: Normal head CT.  No abnormality seen to explain headache.

## 2020-12-17 ENCOUNTER — Other Ambulatory Visit: Payer: Self-pay

## 2020-12-17 ENCOUNTER — Emergency Department
Admission: EM | Admit: 2020-12-17 | Discharge: 2020-12-17 | Disposition: A | Discharge status: Home / Self Care | Payer: Self-pay | Attending: Emergency Medicine | Admitting: Emergency Medicine

## 2020-12-17 ENCOUNTER — Encounter: Payer: Self-pay | Admitting: Emergency Medicine

## 2020-12-17 DIAGNOSIS — F10929 Alcohol use, unspecified with intoxication, unspecified: Secondary | ICD-10-CM

## 2020-12-17 DIAGNOSIS — F1994 Other psychoactive substance use, unspecified with psychoactive substance-induced mood disorder: Secondary | ICD-10-CM

## 2020-12-17 DIAGNOSIS — Z20822 Contact with and (suspected) exposure to covid-19: Secondary | ICD-10-CM | POA: Insufficient documentation

## 2020-12-17 DIAGNOSIS — F1010 Alcohol abuse, uncomplicated: Secondary | ICD-10-CM | POA: Diagnosis present

## 2020-12-17 DIAGNOSIS — R7989 Other specified abnormal findings of blood chemistry: Secondary | ICD-10-CM

## 2020-12-17 DIAGNOSIS — Z046 Encounter for general psychiatric examination, requested by authority: Secondary | ICD-10-CM | POA: Insufficient documentation

## 2020-12-17 DIAGNOSIS — F1914 Other psychoactive substance abuse with psychoactive substance-induced mood disorder: Secondary | ICD-10-CM | POA: Insufficient documentation

## 2020-12-17 DIAGNOSIS — F322 Major depressive disorder, single episode, severe without psychotic features: Secondary | ICD-10-CM | POA: Insufficient documentation

## 2020-12-17 DIAGNOSIS — R7401 Elevation of levels of liver transaminase levels: Secondary | ICD-10-CM | POA: Insufficient documentation

## 2020-12-17 DIAGNOSIS — F17210 Nicotine dependence, cigarettes, uncomplicated: Secondary | ICD-10-CM | POA: Insufficient documentation

## 2020-12-17 DIAGNOSIS — Z79899 Other long term (current) drug therapy: Secondary | ICD-10-CM | POA: Insufficient documentation

## 2020-12-17 DIAGNOSIS — Y906 Blood alcohol level of 120-199 mg/100 ml: Secondary | ICD-10-CM | POA: Insufficient documentation

## 2020-12-17 LAB — COMPREHENSIVE METABOLIC PANEL
ALT: 125 U/L — ABNORMAL HIGH (ref 0–44)
AST: 130 U/L — ABNORMAL HIGH (ref 15–41)
Albumin: 4.9 g/dL (ref 3.5–5.0)
Alkaline Phosphatase: 83 U/L (ref 38–126)
Anion gap: 10 (ref 5–15)
BUN: 6 mg/dL (ref 6–20)
CO2: 28 mmol/L (ref 22–32)
Calcium: 9.5 mg/dL (ref 8.9–10.3)
Chloride: 100 mmol/L (ref 98–111)
Creatinine, Ser: 0.83 mg/dL (ref 0.61–1.24)
GFR, Estimated: >60 mL/min (ref >60)
Glucose, Bld: 108 mg/dL — ABNORMAL HIGH (ref 70–99)
Potassium: 4 mmol/L (ref 3.5–5.1)
Sodium: 138 mmol/L (ref 135–145)
Total Bilirubin: 0.8 mg/dL (ref 0.3–1.2)
Total Protein: 8.5 g/dL — ABNORMAL HIGH (ref 6.5–8.1)

## 2020-12-17 LAB — SALICYLATE LEVEL: Salicylate Lvl: <7 mg/dL — ABNORMAL LOW (ref 7.0–30.0)

## 2020-12-17 LAB — CBC
HCT: 49.2 % (ref 39.0–52.0)
Hemoglobin: 18 g/dL — ABNORMAL HIGH (ref 13.0–17.0)
MCH: 36.4 pg — ABNORMAL HIGH (ref 26.0–34.0)
MCHC: 36.6 g/dL — ABNORMAL HIGH (ref 30.0–36.0)
MCV: 99.6 fL (ref 80.0–100.0)
Platelets: 246 10*3/uL (ref 150–400)
RBC: 4.94 MIL/uL (ref 4.22–5.81)
RDW: 13 % (ref 11.5–15.5)
WBC: 6.2 10*3/uL (ref 4.0–10.5)
nRBC: 0 % (ref 0.0–0.2)

## 2020-12-17 LAB — URINE DRUG SCREEN, QUALITATIVE (ARMC ONLY)
Amphetamines, Ur Screen: NOT DETECTED
Barbiturates, Ur Screen: NOT DETECTED
Benzodiazepine, Ur Scrn: NOT DETECTED
Cannabinoid 50 Ng, Ur ~~LOC~~: NOT DETECTED
Cocaine Metabolite,Ur ~~LOC~~: NOT DETECTED
MDMA (Ecstasy)Ur Screen: NOT DETECTED
Methadone Scn, Ur: NOT DETECTED
Opiate, Ur Screen: NOT DETECTED
Phencyclidine (PCP) Ur S: NOT DETECTED
Tricyclic, Ur Screen: NOT DETECTED

## 2020-12-17 LAB — ACETAMINOPHEN LEVEL: Acetaminophen (Tylenol), Serum: <10 ug/mL — ABNORMAL LOW (ref 10–30)

## 2020-12-17 LAB — RESP PANEL BY RT-PCR (FLU A&B, COVID) ARPGX2
Influenza A by PCR: NEGATIVE
Influenza B by PCR: NEGATIVE
SARS Coronavirus 2 by RT PCR: NEGATIVE

## 2020-12-17 LAB — ETHANOL: Alcohol, Ethyl (B): 145 mg/dL — ABNORMAL HIGH (ref <10)

## 2020-12-17 NOTE — ED Notes (Signed)
Pt was asleep so NT wasn't able to do covid swab.

## 2020-12-17 NOTE — ED Notes (Signed)
Pt in interview room for psych consult

## 2020-12-17 NOTE — ED Triage Notes (Signed)
Pt to ED via [facility] with IVC papers, per IVC papers pt stated that he wants to kill himself and asked to purchase a gun after drinking alcohol. Per IVC papers pt has been talking abnormally and causing concern to friends and coworkers and has possibly not been taking his medications for his Bipolar disorder.   Pt currently denies SI/HI at this time.

## 2020-12-17 NOTE — ED Provider Notes (Signed)
[facility] Emergency Department Provider Note  ____________________________________________   Event Date/Time   First MD Initiated Contact with Patient 12/17/20 0319     (approximate)  I have reviewed the triage vital signs and the nursing notes.   HISTORY  Chief Complaint IVC  Level 5 caveat:  history/ROS limited by acute intoxication  HPI Louis Wade is a 35 y.o. male with medical history as listed below who presents under involuntary commitment for psychiatric evaluation.  Reportedly he has been exhibiting bizarre behavior, drinking alcohol, trying to buy a gun, and threatening to kill himself.  He denies all of this but has no explanation for what was reported.  He has a prior history in the record of major depressive disorder, alcohol abuse, and suicidal behavior.  The patient says that nothing is bothering him currently and he is not having any pain.  He said he just wants to sleep.     Past Medical History:  Diagnosis Date   Thyroid disease     Patient Active Problem List   Diagnosis Date Noted   New onset of headaches 08/06/2018   Suicidal behavior 08/05/2018   Overdose 08/05/2018   MDD (major depressive disorder), severe (HCC) 08/05/2018   Alcohol abuse 08/05/2018   Thyroid disease 08/05/2018    Past Surgical History:  Procedure Laterality Date   TONSILLECTOMY      Prior to Admission medications   Medication Sig Start Date End Date Taking? Authorizing Provider  citalopram (CELEXA) 20 MG tablet Take 1 tablet (20 mg total) by mouth daily. 08/10/18   Clapacs, Jackquline Denmark, MD  levothyroxine (SYNTHROID) 50 MCG tablet Take 1 tablet (50 mcg total) by mouth daily at 6 (six) AM. 08/10/18   Clapacs, Jackquline Denmark, MD    Allergies Patient has no known allergies.  History reviewed. No pertinent family history.  Social History Social History   Tobacco Use   Smoking status: Every Day    Packs/day: 0.50    Types: Cigarettes   Smokeless  tobacco: Never  Substance Use Topics   Alcohol use: Yes   Drug use: Not Currently    Types: Marijuana    Review of Systems Level 5 caveat:  history/ROS limited by acute intoxication   ____________________________________________   PHYSICAL EXAM:  VITAL SIGNS: ED Triage Vitals  Enc Vitals Group     BP 12/17/20 0155 121/85     Pulse Rate 12/17/20 0155 90     Resp 12/17/20 0155 18     Temp 12/17/20 0155 97.7 F (36.5 C)     Temp Source 12/17/20 0155 Oral     SpO2 12/17/20 0155 97 %     Weight 12/17/20 0153 95.3 kg (210 lb)     Height 12/17/20 0153 1.753 m (5\' 9" )     Head Circumference --      Peak Flow --      Pain Score 12/17/20 0153 0     Pain Loc --      Pain Edu? --      Excl. in GC? --     Constitutional: Alert and oriented.  Eyes: Conjunctivae are normal.  Head: Atraumatic. Nose: No congestion/rhinnorhea. Mouth/Throat: Patient is wearing a mask. Neck: No stridor.  No meningeal signs.   Cardiovascular: Normal rate, regular rhythm. Good peripheral circulation. Respiratory: Normal respiratory effort.  No retractions. Gastrointestinal: Soft and nontender. No distention.  Musculoskeletal: No lower extremity tenderness nor edema. No gross deformities of extremities. Neurologic:  Normal speech and  language. No gross focal neurologic deficits are appreciated.  Skin:  Skin is warm, dry and intact. Psychiatric: Mood and affect are normal.  Admits to alcohol intoxication.  Denies SI and the other claims made against him in the IVC paperwork.  ____________________________________________   LABS (all labs ordered are listed, but only abnormal results are displayed)  Labs Reviewed  COMPREHENSIVE METABOLIC PANEL - Abnormal; Notable for the following components:      Result Value   Glucose, Bld 108 (*)    Total Protein 8.5 (*)    AST 130 (*)    ALT 125 (*)    All other components within normal limits  ETHANOL - Abnormal; Notable for the following components:    Alcohol, Ethyl (B) 145 (*)    All other components within normal limits  SALICYLATE LEVEL - Abnormal; Notable for the following components:   Salicylate Lvl <7.0 (*)    All other components within normal limits  ACETAMINOPHEN LEVEL - Abnormal; Notable for the following components:   Acetaminophen (Tylenol), Serum <10 (*)    All other components within normal limits  CBC - Abnormal; Notable for the following components:   Hemoglobin 18.0 (*)    MCH 36.4 (*)    MCHC 36.6 (*)    All other components within normal limits  RESP PANEL BY RT-PCR (FLU A&B, COVID) ARPGX2  URINE DRUG SCREEN, QUALITATIVE ([facility] ONLY)   ____________________________________________   INITIAL IMPRESSION / MDM / ASSESSMENT AND PLAN / ED COURSE  As part of my medical decision making, I reviewed the following data within the electronic MEDICAL RECORD NUMBER Nursing notes reviewed and incorporated, Labs reviewed , EKG interpreted , Old chart reviewed, A consult was requested from this/these consultant(s) Psychiatry, and Notes from prior ED visits   Differential diagnosis includes, but is not limited to, intoxication, substance-induced mood disorder, depressive disorder, adjustment disorder.  Based on the concerning reports from the IVC paperwork, I will uphold the IVC for now.  Patient's vital signs are stable.  Urine drug screen is negative, ethanol elevated at 145, CBC normal, comprehensive metabolic panel is normal other than mild elevation of his LFTs which is likely due to his drinking, but he has no abdominal pain or tenderness.  Patient will be clinically sober by the time psych is able to assess him.  The patient has been placed in psychiatric observation due to the need to provide a safe environment for the patient while obtaining psychiatric consultation and evaluation, as well as ongoing medical and medication management to treat the patient's condition.  The patient has been placed under full IVC at this  time.            ____________________________________________  FINAL CLINICAL IMPRESSION(S) / ED DIAGNOSES  Final diagnoses:  Alcoholic intoxication with complication (HCC)  Elevated LFTs     MEDICATIONS GIVEN DURING THIS VISIT:  Medications - No data to display   ED Discharge Orders     None        Note:  This document was prepared using Dragon voice recognition software and may include unintentional dictation errors.   Loleta Rose, MD 12/17/20 [phone]

## 2020-12-17 NOTE — ED Notes (Signed)
IVC, pending consult 

## 2020-12-17 NOTE — ED Notes (Signed)
This tech and tech [employer] dressed out pt in triage  Pt belongings: cowboy boots(tan and burgundy) White socks Grey shirt Charcoal colored belt Grey lime green hat Olive colored shades Blue jeans Black underwear [employer] 2 cell phones Lighter Pack of cigarettes Lose coins/cash in wallet Blue earrings

## 2020-12-17 NOTE — ED Provider Notes (Signed)
The patient has been evaluated at bedside by Dr. Toni Amend, psychiatry.  Patient is clinically stable.  Not felt to be a danger to self or others.  No SI or Hi.  No indication for inpatient psychiatric admission at this time.  Appropriate for continued outpatient therapy.    Willy Eddy, MD 12/17/20 1115

## 2020-12-17 NOTE — Consult Note (Signed)
Louis Wade   Reason for Wade: Wade for 35 year old man with a history of alcohol abuse came into the hospital last night with reports of suicidal ideation Referring Physician: Roxan Hockey Patient Identification: Louis Wade MRN:  [id number] Principal Diagnosis: Alcohol abuse Diagnosis:  Principal Problem:   Alcohol abuse Active Problems:   Substance induced mood disorder (HCC)   Total Time spent with patient: 1 hour  Subjective:   Louis Wade is a 35 y.o. male patient admitted with "I just got drunk".  HPI: Patient seen chart reviewed.  Patient was brought in last night by police.  He says he was at a friend's house drinking.  He assumes that his friend must have called 911 because he misunderstood what the patient was trying to say about suicide.  Patient claims that all he ever meant to say was that if he were to want to kill himself no one would be able to stop him.  He absolutely denies to me that he ever actually said that he wanted to kill himself.  Denies having any suicidal thoughts recently.  Patient says that he had been off of alcohol for a while and his mood was feeling better.  Just recently had a couple days of getting back to drinking.  Denies any other drugs.  Currently patient said his mood is feeling fine.  Denies any suicidal thought denies psychosis.  Denies seeing things.  Patient is currently staying with his parents working regularly not attending any outpatient mental health treatment.  Denies hallucinations or psychotic symptoms denies homicidal thought.  Past Psychiatric History: Patient has a history of mood symptoms primarily in the context of alcohol use.  He has had hospital visits before for similar circumstances and for accidental overdoses.  No history of DTs or seizures  Risk to Self:   Risk to Others:   Prior Inpatient Therapy:   Prior Outpatient Therapy:    Past Medical History:  Past Medical History:  Diagnosis  Date   Thyroid disease     Past Surgical History:  Procedure Laterality Date   TONSILLECTOMY     Family History: History reviewed. No pertinent family history. Family Psychiatric  History: No known family history identified Social History:  Social History   Substance and Sexual Activity  Alcohol Use Yes     Social History   Substance and Sexual Activity  Drug Use Not Currently   Types: Marijuana    Social History   Socioeconomic History   Marital status: Single    Spouse name: Not on file   Number of children: Not on file   Years of education: Not on file   Highest education level: Not on file  Occupational History   Not on file  Tobacco Use   Smoking status: Every Day    Packs/day: 0.50    Types: Cigarettes   Smokeless tobacco: Never  Substance and Sexual Activity   Alcohol use: Yes   Drug use: Not Currently    Types: Marijuana   Sexual activity: Not on file  Other Topics Concern   Not on file  Social History Narrative   Not on file   Social Determinants of Health   Financial Resource Strain: Not on file  Food Insecurity: Not on file  Transportation Needs: Not on file  Physical Activity: Not on file  Stress: Not on file  Social Connections: Not on file   Additional Social History:    Allergies:  No Known Allergies  Labs:  Results for orders placed or performed during the hospital encounter of 12/17/20 (from the past 48 hour(s))  Comprehensive metabolic panel     Status: Abnormal   Collection Time: 12/17/20  1:57 AM  Result Value Ref Range   Sodium 138 135 - 145 mmol/L   Potassium 4.0 3.5 - 5.1 mmol/L   Chloride 100 98 - 111 mmol/L   CO2 28 22 - 32 mmol/L   Glucose, Bld 108 (H) 70 - 99 mg/dL    Comment: Glucose reference range applies only to samples taken after fasting for at least 8 hours.   BUN 6 6 - 20 mg/dL   Creatinine, Ser 7.03 0.61 - 1.24 mg/dL   Calcium 9.5 8.9 - 50.0 mg/dL   Total Protein 8.5 (H) 6.5 - 8.1 g/dL   Albumin 4.9 3.5 -  5.0 g/dL   AST 938 (H) 15 - 41 U/L   ALT 125 (H) 0 - 44 U/L   Alkaline Phosphatase 83 38 - 126 U/L   Total Bilirubin 0.8 0.3 - 1.2 mg/dL   GFR, Estimated >18 >29 mL/min    Comment: (NOTE) Calculated using the CKD-EPI Creatinine Equation (2021)    Anion gap 10 5 - 15    Comment: Performed at [facility], [address] [place], [place] [address]  Ethanol     Status: Abnormal   Collection Time: 12/17/20  1:57 AM  Result Value Ref Range   Alcohol, Ethyl (B) 145 (H) <10 mg/dL    Comment: (NOTE) Lowest detectable limit for serum alcohol is 10 mg/dL.  For medical purposes only. Performed at [facility], [address] [place], [place] [address]   Salicylate level     Status: Abnormal   Collection Time: 12/17/20  1:57 AM  Result Value Ref Range   Salicylate Lvl <7.0 (L) 7.0 - 30.0 mg/dL    Comment: Performed at [facility], [address] [place], [place] [address]  Acetaminophen level     Status: Abnormal   Collection Time: 12/17/20  1:57 AM  Result Value Ref Range   Acetaminophen (Tylenol), Serum <10 (L) 10 - 30 ug/mL    Comment: (NOTE) Therapeutic concentrations vary significantly. A range of 10-30 ug/mL  may be an effective concentration for many patients. However, some  are best treated at concentrations outside of this range. Acetaminophen concentrations >150 ug/mL at 4 hours after ingestion  and >50 ug/mL at 12 hours after ingestion are often associated with  toxic reactions.  Performed at [facility], [address] [place], [place] [address]   cbc     Status: Abnormal   Collection Time: 12/17/20  1:57 AM  Result Value Ref Range   WBC 6.2 4.0 - 10.5 K/uL   RBC 4.94 4.22 - 5.81 MIL/uL   Hemoglobin 18.0 (H) 13.0 - 17.0 g/dL   HCT 58.5 27.7 - 82.4 %   MCV 99.6 80.0 - 100.0 fL   MCH 36.4 (H) 26.0 - 34.0 pg   MCHC 36.6 (H) 30.0 - 36.0 g/dL   RDW 23.5 36.1 - 44.3 %   Platelets 246 150 - 400 K/uL   nRBC 0.0 0.0 - 0.2 %     Comment: Performed at [facility], [address] [place], [place] [address]  Urine Drug Screen, Qualitative     Status: None   Collection Time: 12/17/20  1:57 AM  Result Value Ref Range   Tricyclic, Ur Screen NONE DETECTED NONE DETECTED   Amphetamines, Ur  Screen NONE DETECTED NONE DETECTED   MDMA (Ecstasy)Ur Screen NONE DETECTED NONE DETECTED   Cocaine Metabolite,Ur [place] NONE DETECTED NONE DETECTED   Opiate, Ur Screen NONE DETECTED NONE DETECTED   Phencyclidine (PCP) Ur S NONE DETECTED NONE DETECTED   Cannabinoid 50 Ng, Ur [place] NONE DETECTED NONE DETECTED   Barbiturates, Ur Screen NONE DETECTED NONE DETECTED   Benzodiazepine, Ur Scrn NONE DETECTED NONE DETECTED   Methadone Scn, Ur NONE DETECTED NONE DETECTED    Comment: (NOTE) Tricyclics + metabolites, urine    Cutoff 1000 ng/mL Amphetamines + metabolites, urine  Cutoff 1000 ng/mL MDMA (Ecstasy), urine              Cutoff 500 ng/mL Cocaine Metabolite, urine          Cutoff 300 ng/mL Opiate + metabolites, urine        Cutoff 300 ng/mL Phencyclidine (PCP), urine         Cutoff 25 ng/mL Cannabinoid, urine                 Cutoff 50 ng/mL Barbiturates + metabolites, urine  Cutoff 200 ng/mL Benzodiazepine, urine              Cutoff 200 ng/mL Methadone, urine                   Cutoff 300 ng/mL  The urine drug screen provides only a preliminary, unconfirmed analytical test result and should not be used for non-medical purposes. Clinical consideration and professional judgment should be applied to any positive drug screen result due to possible interfering substances. A more specific alternate chemical method must be used in order to obtain a confirmed analytical result. Gas chromatography / mass spectrometry (GC/MS) is the preferred confirm atory method. Performed at [facility], [address] [place], [place] [address]   Resp Panel by RT-PCR (Flu A&B, Covid) Nasopharyngeal Swab     Status: None   Collection Time:  12/17/20  7:20 AM   Specimen: Nasopharyngeal Swab; Nasopharyngeal(NP) swabs in vial transport medium  Result Value Ref Range   SARS Coronavirus 2 by RT PCR NEGATIVE NEGATIVE    Comment: (NOTE) SARS-CoV-2 target nucleic acids are NOT DETECTED.  The SARS-CoV-2 RNA is generally detectable in upper respiratory specimens during the acute phase of infection. The lowest concentration of SARS-CoV-2 viral copies this assay can detect is 138 copies/mL. A negative result does not preclude SARS-Cov-2 infection and should not be used as the sole basis for treatment or other patient management decisions. A negative result may occur with  improper specimen collection/handling, submission of specimen other than nasopharyngeal swab, presence of viral mutation(s) within the areas targeted by this assay, and inadequate number of viral copies(<138 copies/mL). A negative result must be combined with clinical observations, patient history, and epidemiological information. The expected result is Negative.  Fact Sheet for Patients:  BloggerCourse.com  Fact Sheet for Healthcare Providers:  SeriousBroker.it  This test is no t yet approved or cleared by the [place] FDA and  has been authorized for detection and/or diagnosis of SARS-CoV-2 by FDA under an Emergency Use Authorization (EUA). This EUA will remain  in effect (meaning this test can be used) for the duration of the COVID-19 declaration under Section 564(b)(1) of the Act, 21 U.S.C.section 360bbb-3(b)(1), unless the authorization is terminated  or revoked sooner.       Influenza A by PCR NEGATIVE NEGATIVE   Influenza B by PCR NEGATIVE NEGATIVE    Comment: (  NOTE) The Xpert Xpress SARS-CoV-2/FLU/RSV plus assay is intended as an aid in the diagnosis of influenza from Nasopharyngeal swab specimens and should not be used as a sole basis for treatment. Nasal washings and aspirates are  unacceptable for Xpert Xpress SARS-CoV-2/FLU/RSV testing.  Fact Sheet for Patients: BloggerCourse.com  Fact Sheet for Healthcare Providers: SeriousBroker.it  This test is not yet approved or cleared by the [place] FDA and has been authorized for detection and/or diagnosis of SARS-CoV-2 by FDA under an Emergency Use Authorization (EUA). This EUA will remain in effect (meaning this test can be used) for the duration of the COVID-19 declaration under Section 564(b)(1) of the Act, 21 U.S.C. section 360bbb-3(b)(1), unless the authorization is terminated or revoked.  Performed at [facility], [address] [place], [place] [address]     No current facility-administered medications for this encounter.   Current Outpatient Medications  Medication Sig Dispense Refill   citalopram (CELEXA) 20 MG tablet Take 1 tablet (20 mg total) by mouth daily. (Patient not taking: Reported on 12/17/2020) 30 tablet 1   levothyroxine (SYNTHROID) 50 MCG tablet Take 1 tablet (50 mcg total) by mouth daily at 6 (six) AM. (Patient not taking: Reported on 12/17/2020) 30 tablet 1    Musculoskeletal: Strength & Muscle Tone: within normal limits Gait & Station: normal Patient leans: N/A            Psychiatric Specialty Exam:  Presentation  General Appearance:  No data recorded Eye Contact: No data recorded Speech: No data recorded Speech Volume: No data recorded Handedness: No data recorded  Mood and Affect  Mood: No data recorded Affect: No data recorded  Thought Process  Thought Processes: No data recorded Descriptions of Associations:No data recorded Orientation:No data recorded Thought Content:No data recorded History of Schizophrenia/Schizoaffective disorder:No data recorded Duration of Psychotic Symptoms:No data recorded Hallucinations:No data recorded Ideas of Reference:No data recorded Suicidal  Thoughts:No data recorded Homicidal Thoughts:No data recorded  Sensorium  Memory: No data recorded Judgment: No data recorded Insight: No data recorded  Executive Functions  Concentration: No data recorded Attention Span: No data recorded Recall: No data recorded Fund of Knowledge: No data recorded Language: No data recorded  Psychomotor Activity  Psychomotor Activity: No data recorded  Assets  Assets: No data recorded  Sleep  Sleep: No data recorded  Physical Exam: Physical Exam Vitals and nursing note reviewed.  Constitutional:      Appearance: Normal appearance.  HENT:     Head: Normocephalic and atraumatic.     Mouth/Throat:     Pharynx: Oropharynx is clear.  Eyes:     Pupils: Pupils are equal, round, and reactive to light.  Cardiovascular:     Rate and Rhythm: Normal rate and regular rhythm.  Pulmonary:     Effort: Pulmonary effort is normal.     Breath sounds: Normal breath sounds.  Abdominal:     General: Abdomen is flat.     Palpations: Abdomen is soft.  Musculoskeletal:        General: Normal range of motion.  Skin:    General: Skin is warm and dry.  Neurological:     General: No focal deficit present.     Mental Status: He is alert. Mental status is at baseline.  Psychiatric:        Attention and Perception: Attention normal.        Mood and Affect: Mood normal.        Speech: Speech normal.  Behavior: Behavior is cooperative.        Thought Content: Thought content normal.        Cognition and Memory: Cognition normal. Memory is impaired.        Judgment: Judgment normal.   Review of Systems  Constitutional: Negative.   HENT: Negative.    Eyes: Negative.   Respiratory: Negative.    Cardiovascular: Negative.   Gastrointestinal: Negative.   Musculoskeletal: Negative.   Skin: Negative.   Neurological: Negative.   Psychiatric/Behavioral:  Positive for substance abuse. Negative for depression, hallucinations and suicidal  ideas.   Blood pressure 119/70, pulse 86, temperature 97.7 F (36.5 C), temperature source Oral, resp. rate 17, height 5\' 9"  (1.753 m), weight 95.3 kg, SpO2 97 %. Body mass index is 31.01 kg/m.  Treatment Plan Summary: Plan 35 year old man with a history of alcohol abuse and mood instability is currently sober not showing any signs of withdrawal.  Pleasant and appropriate in the interview he absolutely denies any suicidal thoughts whatsoever.  Shows reasonably good insight.  Patient has been encouraged to strongly consider getting involved in outpatient treatment.  He was visited by the liaison from [facility] today and given information about how to contact them.  Psychoeducation completed.  Otherwise patient at this point no longer needs hospital level care.  Case reviewed with emergency room physician.  Disposition: No evidence of imminent risk to self or others at present.   Patient does not meet criteria for psychiatric inpatient admission. Supportive therapy provided about ongoing stressors.  [facility], MD 12/17/2020 12:00 PM

## 2020-12-17 NOTE — ED Notes (Signed)
Gave breakfast tray with juice, patient states he didn't want the tray.

## 2021-12-18 ENCOUNTER — Other Ambulatory Visit: Payer: Self-pay

## 2021-12-18 DIAGNOSIS — Z021 Encounter for pre-employment examination: Secondary | ICD-10-CM

## 2021-12-18 NOTE — Progress Notes (Signed)
Presents to [place] for on-site pre-employment drug screen.  [facility] Acct #:  [id number] [facility] Specimen #:  [id number]  Rapid drug screen results = Negative  AMD

## 2022-07-29 ENCOUNTER — Encounter: Payer: Self-pay | Admitting: Emergency Medicine

## 2022-07-29 ENCOUNTER — Emergency Department
Admission: EM | Admit: 2022-07-29 | Discharge: 2022-07-29 | Disposition: A | Discharge status: Home / Self Care | Payer: Self-pay | Attending: Emergency Medicine | Admitting: Emergency Medicine

## 2022-07-29 ENCOUNTER — Other Ambulatory Visit: Payer: Self-pay

## 2022-07-29 ENCOUNTER — Emergency Department: Payer: Self-pay

## 2022-07-29 DIAGNOSIS — X58XXXS Exposure to other specified factors, sequela: Secondary | ICD-10-CM | POA: Insufficient documentation

## 2022-07-29 DIAGNOSIS — S62604S Fracture of unspecified phalanx of right ring finger, sequela: Secondary | ICD-10-CM

## 2022-07-29 DIAGNOSIS — S62624S Displaced fracture of medial phalanx of right ring finger, sequela: Secondary | ICD-10-CM | POA: Insufficient documentation

## 2022-07-29 NOTE — ED Triage Notes (Signed)
Patient to ED via POV fro finger injury- right hand, ring finger. On Sunday patient dislocated finger on rope swing. Patient placed finger back in place but still having pain and swelling.

## 2022-07-29 NOTE — Discharge Instructions (Signed)
Take over-the-counter ibuprofen and Tylenol for pain as needed.  Follow-up with orthopedics for further management.

## 2022-07-29 NOTE — ED Provider Notes (Signed)
[facility] Emergency Department Provider Note     Event Date/Time   First MD Initiated Contact with Patient 07/29/22 1759     (approximate)   History   Finger Injury   HPI  Louis Wade is a 37 y.o. male with no significant past medical history who presents to the emergency department for a finger injury to his right ring finger x 2 days.  Patient reports he was rope swinging over the weekend and felt a pop in his right ring finger.  He states he thought it was dislocated and tried to reduce the finger himself but the pain has since worsened and the swelling has increased.  He denies numbness, tingling and loss of mobility.  No other complaints at this time.     Physical Exam   Triage Vital Signs: ED Triage Vitals [07/29/22 1653]  Enc Vitals Group     BP (!) 148/88     Pulse Rate (!) 108     Resp 18     Temp 98.6 F (37 C)     Temp Source Oral     SpO2 97 %     Weight      Height      Head Circumference      Peak Flow      Pain Score 3     Pain Loc      Pain Edu?      Excl. in GC?     Most recent vital signs: Vitals:   07/29/22 1653  BP: (!) 148/88  Pulse: (!) 108  Resp: 18  Temp: 98.6 F (37 C)  SpO2: 97%    General Awake, no distress.  HEENT NCAT. PERRL. EOMI. No rhinorrhea.  CV:  Good peripheral perfusion.  RESP:  Normal effort.  ABD:  No distention.  Other:  Right ring finger over DIP joint reveals mild ecchymosis and edema.  No visible deformities or lacerations.  Skin is intact. Tenderness over the volar and dorsum aspect of distal 2/3 middle phalanx.  No palpable step-off.  Painful with flexion.  There is limited ROM due to pain.  Neurovascular status intact throughout.  Capillary refill is normal and brisk.   ED Results / Procedures / Treatments   RADIOLOGY  I personally viewed and evaluated these images as part of my medical decision making, as well as reviewing the written report by the radiologist.  ED  Provider Interpretation: Right finger ring x-ray reveals a closed displaced fracture of the middle phalanx.    DG Finger Ring Right  Result Date: 07/29/2022 CLINICAL DATA:  Injury. Dislocated finger on rope swing 2 days ago. Patient relocated finger but is still having pain and swelling. EXAM: RIGHT RING FINGER 2+V COMPARISON:  None Available. FINDINGS: There is normal bone mineralization. There are two dominant oblique fracture line lucency is seen within the distal metadiaphysis of the middle phalanx of the fourth finger in a predominantly proximal lateral/radial to distal medial/ulnar orientation. These fracture lines likely minimally contact the far distal medial/ulnar, dorsal articular surface of the middle phalanx at the DIP joint. There is up to 1.5 mm lateral and 1 mm volar displacement of the distal fracture component with respect to the proximal fracture component. Joint spaces are preserved. IMPRESSION: Minimally displaced acute fracture of the distal metadiaphysis of the middle phalanx of the fourth finger extending to the distal medial articular surface. Electronically Signed   By: Neita Garnet M.D.   On: 07/29/2022 17:37  PROCEDURES:  Critical Care performed: No  Procedures   MEDICATIONS ORDERED IN ED: Medications - No data to display   IMPRESSION / MDM / ASSESSMENT AND PLAN / ED COURSE  I reviewed the triage vital signs and the nursing notes.                             Patient primary diagnosis is consistent with closed minimally displaced fracture of middle phalanx of the right 4th digit.   Differential diagnosis includes, but is not limited to fracture, dislocation, tendonitis.  Patient's presentation is most consistent with acute complicated illness / injury requiring diagnostic workup.  37 y.o. male presents to the emergency department for evaluation and treatment of acute right ring finger injury. initial vital signs are initially stable with the exception of  mildly elevated BP and pulse rate.  Will have vitals rechecked prior to discharge. See HPI for further details.   X-ray revealed an acute minimally displaced fracture of the middle phalanx of the right ring digit.  Patient will be placed in buddy tape and static finger splint upon ED discharge.  He is instructed to follow-up with orthopedics for further evaluation and management. Patient is in satisfactory and stable condition for discharge and outpatient follow up at this time. Patient will be discharged with instructions to take ibuprofen for pain as needed.  Patient is to follow up with Z. Audelia Acton, MD orthopedic surgeon for further management. Patient is given ED precautions to return to the ED for any worsening or new symptoms. Patient verbalizes understanding and agrees with assessment and plan.      FINAL CLINICAL IMPRESSION(S) / ED DIAGNOSES   Final diagnoses:  Closed displaced fracture of phalanx of right ring finger, unspecified phalanx, sequela     Rx / DC Orders   ED Discharge Orders     None        Note:  This document was prepared using Dragon voice recognition software and may include unintentional dictation errors.    Romeo Apple, Ramin Zoll A, PA-C 07/29/22 1857    Pilar Jarvis, MD 07/30/22 [phone]

## 2022-09-15 ENCOUNTER — Other Ambulatory Visit: Payer: Self-pay

## 2022-09-15 ENCOUNTER — Emergency Department
Admission: EM | Admit: 2022-09-15 | Discharge: 2022-09-15 | Disposition: A | Discharge status: Home / Self Care | Payer: Self-pay | Attending: Emergency Medicine | Admitting: Emergency Medicine

## 2022-09-15 DIAGNOSIS — L551 Sunburn of second degree: Secondary | ICD-10-CM

## 2022-09-15 DIAGNOSIS — L559 Sunburn, unspecified: Secondary | ICD-10-CM | POA: Insufficient documentation

## 2022-09-15 NOTE — ED Triage Notes (Signed)
Sun burn to top of head yesterday.  Multiple blisters seen to top of head.

## 2022-09-15 NOTE — ED Provider Notes (Signed)
   [facility] Provider Note    Event Date/Time   First MD Initiated Contact with Patient 09/15/22 1340     (approximate)   History   Burn   HPI  Louis Wade is a 37 y.o. male who presents with blistering to the top of his head.  Patient suffered a sunburn yesterday.  He denies fevers, but because of the blistering and some yellowish discharge wanted to get checked out.     Physical Exam   Triage Vital Signs: ED Triage Vitals  Encounter Vitals Group     BP 09/15/22 1233 (!) 143/76     Systolic BP Percentile --      Diastolic BP Percentile --      Pulse Rate 09/15/22 1233 (!) 108     Resp 09/15/22 1233 16     Temp 09/15/22 1233 98.4 F (36.9 C)     Temp src --      SpO2 --      Weight 09/15/22 1232 95.3 kg (210 lb 1.6 oz)     Height 09/15/22 1232 1.753 m (5\' 9" )     Head Circumference --      Peak Flow --      Pain Score 09/15/22 1232 3     Pain Loc --      Pain Education --      Exclude from Growth Chart --     Most recent vital signs: Vitals:   09/15/22 1233  BP: (!) 143/76  Pulse: (!) 108  Resp: 16  Temp: 98.4 F (36.9 C)     General: Awake, no distress.  CV:  Good peripheral perfusion.  Resp:  Normal effort.  Abd:  No distention.  Other:  Skin on the top of the head, patient is bald demonstrates multiple small blisters with some yellowish discharge no concerns for infection   ED Results / Procedures / Treatments   Labs (all labs ordered are listed, but only abnormal results are displayed) Labs Reviewed - No data to display   EKG     RADIOLOGY     PROCEDURES:  Critical Care performed:   Procedures   MEDICATIONS ORDERED IN ED: Medications - No data to display   IMPRESSION / MDM / ASSESSMENT AND PLAN / ED COURSE  I reviewed the triage vital signs and the nursing notes. Patient's presentation is most consistent with acute, uncomplicated illness.  Patient presents with significant sunburn to the top  of the scalp, no blistering noted, recommend supportive care, gentle washing, antibiotic ointment as needed        FINAL CLINICAL IMPRESSION(S) / ED DIAGNOSES   Final diagnoses:  Sunburn, blistering     Rx / DC Orders   ED Discharge Orders     None        Note:  This document was prepared using Dragon voice recognition software and may include unintentional dictation errors.   Jene Every, MD 09/15/22 [phone]

## 2023-05-16 ENCOUNTER — Encounter (HOSPITAL_BASED_OUTPATIENT_CLINIC_OR_DEPARTMENT_OTHER): Payer: Self-pay

## 2023-05-16 ENCOUNTER — Emergency Department (HOSPITAL_BASED_OUTPATIENT_CLINIC_OR_DEPARTMENT_OTHER)
Admission: EM | Admit: 2023-05-16 | Discharge: 2023-05-16 | Disposition: A | Discharge status: Home / Self Care | Payer: Worker's Compensation | Attending: Emergency Medicine | Admitting: Emergency Medicine

## 2023-05-16 ENCOUNTER — Other Ambulatory Visit: Payer: Self-pay

## 2023-05-16 ENCOUNTER — Emergency Department (HOSPITAL_BASED_OUTPATIENT_CLINIC_OR_DEPARTMENT_OTHER): Payer: Self-pay

## 2023-05-16 DIAGNOSIS — W11XXXA Fall on and from ladder, initial encounter: Secondary | ICD-10-CM | POA: Insufficient documentation

## 2023-05-16 DIAGNOSIS — S93402A Sprain of unspecified ligament of left ankle, initial encounter: Secondary | ICD-10-CM | POA: Diagnosis not present

## 2023-05-16 DIAGNOSIS — S99919A Unspecified injury of unspecified ankle, initial encounter: Secondary | ICD-10-CM | POA: Diagnosis present

## 2023-05-16 NOTE — ED Triage Notes (Signed)
 Pt c/o L ankle pain after "falling from 8-ft ladder & landing awkwardly at like 7(pm)." +ROM distal to injury, CNS intact Able to bear weight on ankle

## 2023-05-16 NOTE — ED Provider Notes (Signed)
  EMERGENCY DEPARTMENT AT [facility] Provider Note   CSN: [id number] Arrival date & time: 05/16/23  0028     History  No chief complaint on file.   Louis Wade is a 38 y.o. male.  Presents to the emergency for evaluation of ankle injury.  Patient reports that he was climbing a ladder and slipped, his foot hit multiple rungs on the way down and then he landed awkwardly, injuring the ankle.  Patient complaining of pain in the anterior portion of the ankle when trying to bear weight.       Home Medications Prior to Admission medications   Medication Sig Start Date End Date Taking? Authorizing Provider  citalopram (CELEXA) 20 MG tablet Take 1 tablet (20 mg total) by mouth daily. Patient not taking: Reported on 12/17/2020 08/10/18   Clapacs, Jackquline Denmark, MD  levothyroxine (SYNTHROID) 50 MCG tablet Take 1 tablet (50 mcg total) by mouth daily at 6 (six) AM. Patient not taking: Reported on 12/17/2020 08/10/18   Clapacs, Jackquline Denmark, MD      Allergies    Patient has no known allergies.    Review of Systems   Review of Systems  Physical Exam Updated Vital Signs BP 138/83 (BP Location: Left Arm)   Pulse (!) 112   Temp 99 F (37.2 C) (Oral)   Resp 14   SpO2 97%  Physical Exam Constitutional:      Appearance: Normal appearance.  HENT:     Head: Atraumatic.  Cardiovascular:     Pulses:          Dorsalis pedis pulses are 2+ on the left side.  Musculoskeletal:     Left ankle: Swelling present. No deformity, ecchymosis or lacerations. Tenderness (Diffuse, anterior and anterolateral) present. No lateral malleolus, medial malleolus, base of 5th metatarsal or proximal fibula tenderness. Normal range of motion.  Neurological:     Mental Status: He is alert.     ED Results / Procedures / Treatments   Labs (all labs ordered are listed, but only abnormal results are displayed) Labs Reviewed - No data to display  EKG None  Radiology DG Ankle Complete Left Result  Date: 05/16/2023 CLINICAL DATA:  Fall from ladder, left ankle pain EXAM: LEFT ANKLE COMPLETE - 3+ VIEW COMPARISON:  None Available. FINDINGS: There is no evidence of fracture, dislocation, or joint effusion. There is no evidence of arthropathy or other focal bone abnormality. Soft tissues are unremarkable. IMPRESSION: Negative. Electronically Signed   By: Charlett Nose M.D.   On: 05/16/2023 01:34    Procedures Procedures    Medications Ordered in ED Medications - No data to display  ED Course/ Medical Decision Making/ A&P                                 Medical Decision Making Amount and/or Complexity of Data Reviewed Radiology: ordered.   Presents with ankle injury after a fall.  No deformity on exam, neurovascularly intact.  No open wounds.  X-ray negative.  Patient with swelling and tenderness in the anterior anterior lateral aspect of the ankle consistent with sprain.        Final Clinical Impression(s) / ED Diagnoses Final diagnoses:  Sprain of left ankle, unspecified ligament, initial encounter    Rx / DC Orders ED Discharge Orders     None         Mikhai Bienvenue, Canary Brim, MD 05/16/23 0234

## 2023-07-24 ENCOUNTER — Ambulatory Visit: Payer: Self-pay | Admitting: Internal Medicine
